# Patient Record
Sex: Male | Born: 1965 | Race: Black or African American | Hispanic: No | Marital: Single | Smoking: Never smoker
Health system: Southern US, Community
[De-identification: ages and names within clinical notes are randomized; demographics above are authoritative.]

## PROBLEM LIST (undated history)

## (undated) DIAGNOSIS — M1990 Unspecified osteoarthritis, unspecified site: Secondary | ICD-10-CM

## (undated) DIAGNOSIS — I10 Essential (primary) hypertension: Secondary | ICD-10-CM

## (undated) DIAGNOSIS — I499 Cardiac arrhythmia, unspecified: Secondary | ICD-10-CM

## (undated) DIAGNOSIS — E119 Type 2 diabetes mellitus without complications: Secondary | ICD-10-CM

## (undated) HISTORY — DX: Type 2 diabetes mellitus without complications: E11.9

## (undated) HISTORY — PX: HAND NERVE REPAIR: SHX1728

---

## 1999-09-03 ENCOUNTER — Emergency Department (HOSPITAL_COMMUNITY): Admission: EM | Admit: 1999-09-03 | Discharge: 1999-09-03 | Payer: Self-pay | Admitting: Unknown Physician Specialty

## 1999-09-03 ENCOUNTER — Encounter: Payer: Self-pay | Admitting: Emergency Medicine

## 2009-04-24 DIAGNOSIS — I499 Cardiac arrhythmia, unspecified: Secondary | ICD-10-CM

## 2009-04-24 HISTORY — DX: Cardiac arrhythmia, unspecified: I49.9

## 2010-08-15 ENCOUNTER — Other Ambulatory Visit: Payer: Self-pay | Admitting: Family Medicine

## 2010-08-15 DIAGNOSIS — M545 Low back pain: Secondary | ICD-10-CM

## 2010-08-18 ENCOUNTER — Ambulatory Visit
Admission: RE | Admit: 2010-08-18 | Discharge: 2010-08-18 | Disposition: A | Discharge status: Home / Self Care | Payer: 59 | Source: Ambulatory Visit | Attending: Family Medicine | Admitting: Family Medicine

## 2010-08-18 DIAGNOSIS — M545 Low back pain: Secondary | ICD-10-CM

## 2010-08-18 MED ORDER — GADOBENATE DIMEGLUMINE 529 MG/ML IV SOLN
18.0000 mL | Freq: Once | INTRAVENOUS | Status: AC | PRN
  Administered 2010-08-18: 18 mL via INTRAVENOUS

## 2011-05-29 ENCOUNTER — Encounter (HOSPITAL_COMMUNITY): Payer: Self-pay | Admitting: Respiratory Therapy

## 2011-05-29 ENCOUNTER — Other Ambulatory Visit: Payer: Self-pay | Admitting: Orthopedic Surgery

## 2011-05-31 ENCOUNTER — Other Ambulatory Visit: Payer: Self-pay | Admitting: Orthopedic Surgery

## 2011-06-02 ENCOUNTER — Encounter (HOSPITAL_COMMUNITY)
Admission: RE | Admit: 2011-06-02 | Discharge: 2011-06-02 | Disposition: A | Discharge status: Home / Self Care | Payer: Worker's Compensation | Source: Ambulatory Visit | Attending: Orthopedic Surgery | Admitting: Orthopedic Surgery

## 2011-06-02 ENCOUNTER — Encounter (HOSPITAL_COMMUNITY): Payer: Self-pay

## 2011-06-02 ENCOUNTER — Other Ambulatory Visit: Payer: Self-pay

## 2011-06-02 HISTORY — DX: Unspecified osteoarthritis, unspecified site: M19.90

## 2011-06-02 HISTORY — DX: Essential (primary) hypertension: I10

## 2011-06-02 HISTORY — DX: Cardiac arrhythmia, unspecified: I49.9

## 2011-06-02 LAB — COMPREHENSIVE METABOLIC PANEL
Alkaline Phosphatase: 68 U/L (ref 39–117)
BUN: 18 mg/dL (ref 6–23)
Creatinine, Ser: 0.94 mg/dL (ref 0.50–1.35)
Glucose, Bld: 112 mg/dL — ABNORMAL HIGH (ref 70–99)
Potassium: 4.2 mEq/L (ref 3.5–5.1)
Total Bilirubin: 0.6 mg/dL (ref 0.3–1.2)
Total Protein: 7.5 g/dL (ref 6.0–8.3)

## 2011-06-02 LAB — CBC
HCT: 43.1 % (ref 39.0–52.0)
Hemoglobin: 15.1 g/dL (ref 13.0–17.0)
MCHC: 35 g/dL (ref 30.0–36.0)
MCV: 89.2 fL (ref 78.0–100.0)

## 2011-06-02 LAB — SURGICAL PCR SCREEN
MRSA, PCR: NEGATIVE
Staphylococcus aureus: NEGATIVE

## 2011-06-02 LAB — DIFFERENTIAL
Basophils Absolute: 0 10*3/uL (ref 0.0–0.1)
Eosinophils Relative: 3 % (ref 0–5)
Lymphocytes Relative: 43 % (ref 12–46)
Monocytes Absolute: 0.5 10*3/uL (ref 0.1–1.0)
Monocytes Relative: 8 % (ref 3–12)

## 2011-06-02 LAB — APTT: aPTT: 30 seconds (ref 24–37)

## 2011-06-02 LAB — URINALYSIS, ROUTINE W REFLEX MICROSCOPIC
Leukocytes, UA: NEGATIVE
Nitrite: NEGATIVE
Protein, ur: NEGATIVE mg/dL
Specific Gravity, Urine: 1.025 (ref 1.005–1.030)
Urobilinogen, UA: 1 mg/dL (ref 0.0–1.0)

## 2011-06-02 LAB — TYPE AND SCREEN
ABO/RH(D): A POS
Antibody Screen: NEGATIVE

## 2011-06-02 NOTE — Pre-Procedure Instructions (Addendum)
19 Gary Luna  06/02/2011   Your procedure is scheduled on 2.14.13  Report to [facility] at 920 AM.  Call this number if you have problems the morning of surgery: [phone]   Remember:   Do not eat food:After Midnight.  May have clear liquids: up to 4 Hours before arrival.  Clear liquids include soda, tea, black coffee, apple or grape juice, broth.  Take these medicines the morning of surgery with A SIP OF WATER:STOP meloxicam, omega 3 multi vit  today   Do not wear jewelry, make-up or nail polish.  Do not wear lotions, powders, or perfumes. You may wear deodorant.  Do not shave 48 hours prior to surgery.  Do not bring valuables to the hospital.  Contacts, dentures or bridgework may not be worn into surgery.  Leave suitcase in the car. After surgery it may be brought to your room.  For patients admitted to the hospital, checkout time is 11:00 AM the day of discharge.   Patients discharged the day of surgery will not be allowed to drive home.  Name and phone number of your driver: [name] [phone]  Special Instructions: Incentive Spirometry - Practice and bring it with you on the day of surgery. and CHG Shower Use Special Wash: 1/2 bottle night before surgery and 1/2 bottle morning of surgery.   Please read over the following fact sheets that you were given: Pain Booklet, Coughing and Deep Breathing, Blood Transfusion Information, MRSA Information and Surgical Site Infection Prevention

## 2011-06-07 MED ORDER — CEFAZOLIN SODIUM-DEXTROSE 2-3 GM-% IV SOLR
2.0000 g | INTRAVENOUS | Status: AC
  Administered 2011-06-08: 2 g via INTRAVENOUS
  Filled 2011-06-07: qty 50

## 2011-06-08 ENCOUNTER — Encounter (HOSPITAL_COMMUNITY)
Admission: RE | Disposition: A | Discharge status: Home / Self Care | Payer: Self-pay | Source: Ambulatory Visit | Attending: Orthopedic Surgery

## 2011-06-08 ENCOUNTER — Inpatient Hospital Stay (HOSPITAL_COMMUNITY)
Admission: RE | Admit: 2011-06-08 | Discharge: 2011-06-09 | DRG: 491 | Disposition: A | Discharge status: Home / Self Care | Payer: Worker's Compensation | Source: Ambulatory Visit | Attending: Orthopedic Surgery | Admitting: Orthopedic Surgery

## 2011-06-08 ENCOUNTER — Encounter (HOSPITAL_COMMUNITY): Payer: Self-pay | Admitting: Certified Registered"

## 2011-06-08 ENCOUNTER — Ambulatory Visit (HOSPITAL_COMMUNITY): Payer: Worker's Compensation

## 2011-06-08 ENCOUNTER — Encounter (HOSPITAL_COMMUNITY): Payer: Self-pay | Admitting: *Deleted

## 2011-06-08 ENCOUNTER — Ambulatory Visit (HOSPITAL_COMMUNITY): Payer: Worker's Compensation | Admitting: Certified Registered"

## 2011-06-08 DIAGNOSIS — M129 Arthropathy, unspecified: Secondary | ICD-10-CM | POA: Diagnosis present

## 2011-06-08 DIAGNOSIS — M4800 Spinal stenosis, site unspecified: Secondary | ICD-10-CM

## 2011-06-08 DIAGNOSIS — Z01818 Encounter for other preprocedural examination: Secondary | ICD-10-CM

## 2011-06-08 DIAGNOSIS — M48061 Spinal stenosis, lumbar region without neurogenic claudication: Principal | ICD-10-CM | POA: Diagnosis present

## 2011-06-08 DIAGNOSIS — Z79899 Other long term (current) drug therapy: Secondary | ICD-10-CM

## 2011-06-08 DIAGNOSIS — I10 Essential (primary) hypertension: Secondary | ICD-10-CM | POA: Diagnosis present

## 2011-06-08 DIAGNOSIS — Z01812 Encounter for preprocedural laboratory examination: Secondary | ICD-10-CM

## 2011-06-08 DIAGNOSIS — Z01810 Encounter for preprocedural cardiovascular examination: Secondary | ICD-10-CM

## 2011-06-08 HISTORY — PX: LUMBAR LAMINECTOMY/DECOMPRESSION MICRODISCECTOMY: SHX5026

## 2011-06-08 SURGERY — LUMBAR LAMINECTOMY/DECOMPRESSION MICRODISCECTOMY
Anesthesia: General | Site: Back | Laterality: Bilateral | Wound class: Clean

## 2011-06-08 MED ORDER — ROCURONIUM BROMIDE 100 MG/10ML IV SOLN
INTRAVENOUS | Status: DC | PRN
  Administered 2011-06-08: 50 mg via INTRAVENOUS

## 2011-06-08 MED ORDER — PHENYLEPHRINE HCL 10 MG/ML IJ SOLN
INTRAMUSCULAR | Status: DC | PRN
  Administered 2011-06-08 (×2): 40 ug via INTRAVENOUS
  Administered 2011-06-08: 80 ug via INTRAVENOUS

## 2011-06-08 MED ORDER — ACETAMINOPHEN 325 MG PO TABS: 650.0000 mg | ORAL_TABLET | ORAL | Status: DC | PRN

## 2011-06-08 MED ORDER — HYDROMORPHONE HCL PF 1 MG/ML IJ SOLN
0.2500 mg | INTRAMUSCULAR | Status: DC | PRN
  Administered 2011-06-08 (×2): 0.5 mg via INTRAVENOUS

## 2011-06-08 MED ORDER — ADULT MULTIVITAMIN W/MINERALS CH
1.0000 | ORAL_TABLET | Freq: Every day | ORAL | Status: DC
  Administered 2011-06-09: 1 via ORAL
  Filled 2011-06-08: qty 1

## 2011-06-08 MED ORDER — BUPIVACAINE-EPINEPHRINE 0.25% -1:200000 IJ SOLN
INTRAMUSCULAR | Status: DC | PRN
  Administered 2011-06-08: 6 mL

## 2011-06-08 MED ORDER — METHYLPREDNISOLONE ACETATE 40 MG/ML IJ SUSP
INTRAMUSCULAR | Status: DC | PRN
  Administered 2011-06-08: 80 mg

## 2011-06-08 MED ORDER — MENTHOL 3 MG MT LOZG
1.0000 | LOZENGE | OROMUCOSAL | Status: DC | PRN
  Administered 2011-06-09: 3 mg via ORAL
  Filled 2011-06-08: qty 9

## 2011-06-08 MED ORDER — PROPOFOL 10 MG/ML IV EMUL
INTRAVENOUS | Status: DC | PRN
  Administered 2011-06-08: 200 mg via INTRAVENOUS

## 2011-06-08 MED ORDER — VECURONIUM BROMIDE 10 MG IV SOLR
INTRAVENOUS | Status: DC | PRN
  Administered 2011-06-08: 1 mg via INTRAVENOUS
  Administered 2011-06-08: 2 mg via INTRAVENOUS

## 2011-06-08 MED ORDER — HEMOSTATIC AGENTS (NO CHARGE) OPTIME
TOPICAL | Status: DC | PRN
  Administered 2011-06-08: 1 via TOPICAL

## 2011-06-08 MED ORDER — ONDANSETRON HCL 4 MG/2ML IJ SOLN: 4.0000 mg | Freq: Four times a day (QID) | INTRAMUSCULAR | Status: DC | PRN

## 2011-06-08 MED ORDER — POTASSIUM CHLORIDE IN NACL 20-0.9 MEQ/L-% IV SOLN
INTRAVENOUS | Status: DC
  Filled 2011-06-08 (×4): qty 1000

## 2011-06-08 MED ORDER — MIDAZOLAM HCL 5 MG/5ML IJ SOLN
INTRAMUSCULAR | Status: DC | PRN
  Administered 2011-06-08: 2 mg via INTRAVENOUS

## 2011-06-08 MED ORDER — PHENOL 1.4 % MT LIQD: 1.0000 | OROMUCOSAL | Status: DC | PRN

## 2011-06-08 MED ORDER — SODIUM CHLORIDE 0.9 % IJ SOLN: 3.0000 mL | Freq: Two times a day (BID) | INTRAMUSCULAR | Status: DC

## 2011-06-08 MED ORDER — DIAZEPAM 5 MG PO TABS
5.0000 mg | ORAL_TABLET | Freq: Four times a day (QID) | ORAL | Status: DC | PRN
  Administered 2011-06-08 – 2011-06-09 (×3): 5 mg via ORAL
  Filled 2011-06-08 (×3): qty 1

## 2011-06-08 MED ORDER — HYDROCHLOROTHIAZIDE 25 MG PO TABS
25.0000 mg | ORAL_TABLET | Freq: Every day | ORAL | Status: DC
  Administered 2011-06-08 – 2011-06-09 (×2): 25 mg via ORAL
  Filled 2011-06-08 (×2): qty 1

## 2011-06-08 MED ORDER — DEXAMETHASONE SODIUM PHOSPHATE 4 MG/ML IJ SOLN
INTRAMUSCULAR | Status: DC | PRN
  Administered 2011-06-08 (×2): 4 mg via INTRAVENOUS

## 2011-06-08 MED ORDER — 0.9 % SODIUM CHLORIDE (POUR BTL) OPTIME
TOPICAL | Status: DC | PRN
  Administered 2011-06-08: 1000 mL

## 2011-06-08 MED ORDER — NEOSTIGMINE METHYLSULFATE 1 MG/ML IJ SOLN
INTRAMUSCULAR | Status: DC | PRN
  Administered 2011-06-08: 3 mg via INTRAVENOUS

## 2011-06-08 MED ORDER — FENTANYL CITRATE 0.05 MG/ML IJ SOLN
INTRAMUSCULAR | Status: DC | PRN
  Administered 2011-06-08: 50 ug via INTRAVENOUS
  Administered 2011-06-08: 150 ug via INTRAVENOUS
  Administered 2011-06-08: 50 ug via INTRAVENOUS
  Administered 2011-06-08 (×3): 100 ug via INTRAVENOUS

## 2011-06-08 MED ORDER — GLYCOPYRROLATE 0.2 MG/ML IJ SOLN
INTRAMUSCULAR | Status: DC | PRN
  Administered 2011-06-08: .4 mg via INTRAVENOUS

## 2011-06-08 MED ORDER — THROMBIN 20000 UNITS EX KIT
PACK | CUTANEOUS | Status: DC | PRN
  Administered 2011-06-08: 13:00:00 via TOPICAL

## 2011-06-08 MED ORDER — DEXTROSE 5 % IV SOLN
INTRAVENOUS | Status: DC | PRN
  Administered 2011-06-08: 12:00:00 via INTRAVENOUS

## 2011-06-08 MED ORDER — BENAZEPRIL-HYDROCHLOROTHIAZIDE 20-25 MG PO TABS: 1.0000 | ORAL_TABLET | Freq: Every day | ORAL | Status: DC

## 2011-06-08 MED ORDER — ACETAMINOPHEN 650 MG RE SUPP: 650.0000 mg | RECTAL | Status: DC | PRN

## 2011-06-08 MED ORDER — OXYCODONE-ACETAMINOPHEN 5-325 MG PO TABS
1.0000 | ORAL_TABLET | ORAL | Status: DC | PRN
  Administered 2011-06-08 – 2011-06-09 (×3): 2 via ORAL
  Filled 2011-06-08 (×3): qty 2

## 2011-06-08 MED ORDER — BENAZEPRIL HCL 20 MG PO TABS
20.0000 mg | ORAL_TABLET | Freq: Every day | ORAL | Status: DC
  Administered 2011-06-08 – 2011-06-09 (×2): 20 mg via ORAL
  Filled 2011-06-08 (×2): qty 1

## 2011-06-08 MED ORDER — THERA M PLUS PO TABS: 1.0000 | ORAL_TABLET | Freq: Every day | ORAL | Status: DC

## 2011-06-08 MED ORDER — POVIDONE-IODINE 7.5 % EX SOLN
Freq: Once | CUTANEOUS | Status: DC
  Filled 2011-06-08: qty 118

## 2011-06-08 MED ORDER — MORPHINE SULFATE 4 MG/ML IJ SOLN
2.0000 mg | INTRAMUSCULAR | Status: DC | PRN
  Administered 2011-06-08: 2 mg via INTRAVENOUS
  Filled 2011-06-08: qty 1

## 2011-06-08 MED ORDER — CEFAZOLIN SODIUM 1-5 GM-% IV SOLN
1.0000 g | Freq: Three times a day (TID) | INTRAVENOUS | Status: AC
  Administered 2011-06-08 – 2011-06-09 (×2): 1 g via INTRAVENOUS
  Filled 2011-06-08 (×4): qty 50

## 2011-06-08 MED ORDER — SODIUM CHLORIDE 0.9 % IJ SOLN: 3.0000 mL | INTRAMUSCULAR | Status: DC | PRN

## 2011-06-08 MED ORDER — LACTATED RINGERS IV SOLN
INTRAVENOUS | Status: DC | PRN
  Administered 2011-06-08 (×3): via INTRAVENOUS

## 2011-06-08 MED ORDER — HYDROXYZINE HCL 25 MG PO TABS: 50.0000 mg | ORAL_TABLET | ORAL | Status: DC | PRN

## 2011-06-08 MED ORDER — ZOLPIDEM TARTRATE 5 MG PO TABS: 5.0000 mg | ORAL_TABLET | Freq: Every evening | ORAL | Status: DC | PRN

## 2011-06-08 MED ORDER — HYDROXYZINE HCL 50 MG/ML IM SOLN: 50.0000 mg | INTRAMUSCULAR | Status: DC | PRN

## 2011-06-08 MED ORDER — LACTATED RINGERS IV SOLN
INTRAVENOUS | Status: DC
  Administered 2011-06-08: 11:00:00 via INTRAVENOUS

## 2011-06-08 MED ORDER — ONDANSETRON HCL 4 MG/2ML IJ SOLN
INTRAMUSCULAR | Status: DC | PRN
  Administered 2011-06-08: 4 mg via INTRAVENOUS

## 2011-06-08 SURGICAL SUPPLY — 60 items
APL SKNCLS STERI-STRIP NONHPOA (GAUZE/BANDAGES/DRESSINGS)
BENZOIN TINCTURE PRP APPL 2/3 (GAUZE/BANDAGES/DRESSINGS) IMPLANT
BUR ROUND PRECISION 4.0 (BURR) IMPLANT
CANISTER SUCTION 2500CC (MISCELLANEOUS) ×2 IMPLANT
CLOTH BEACON ORANGE TIMEOUT ST (SAFETY) ×2 IMPLANT
CONT SPEC STER OR (MISCELLANEOUS) IMPLANT
CORDS BIPOLAR (ELECTRODE) ×2 IMPLANT
COVER SURGICAL LIGHT HANDLE (MISCELLANEOUS) ×2 IMPLANT
DRAIN CHANNEL 10F 3/8 F FF (DRAIN) IMPLANT
DRAIN WOUND SNY 15 RND (WOUND CARE) ×2 IMPLANT
DRAPE POUCH INSTRU U-SHP 10X18 (DRAPES) ×4 IMPLANT
DRAPE SURG 17X23 STRL (DRAPES) ×8 IMPLANT
DURAPREP 26ML APPLICATOR (WOUND CARE) ×2 IMPLANT
ELECT BLADE 4.0 EZ CLEAN MEGAD (MISCELLANEOUS)
ELECT BLADE 6.5 EXT (BLADE) IMPLANT
ELECT CAUTERY BLADE 6.4 (BLADE) ×2 IMPLANT
ELECT REM PT RETURN 9FT ADLT (ELECTROSURGICAL) ×2
ELECTRODE BLDE 4.0 EZ CLN MEGD (MISCELLANEOUS) IMPLANT
ELECTRODE REM PT RTRN 9FT ADLT (ELECTROSURGICAL) ×1 IMPLANT
EVACUATOR SILICONE 100CC (DRAIN) ×2 IMPLANT
FILTER STRAW FLUID ASPIR (MISCELLANEOUS) ×2 IMPLANT
GAUZE SPONGE 4X4 16PLY XRAY LF (GAUZE/BANDAGES/DRESSINGS) ×4 IMPLANT
GLOVE BIO SURGEON STRL SZ8 (GLOVE) ×2 IMPLANT
GLOVE BIOGEL PI IND STRL 8 (GLOVE) ×1 IMPLANT
GLOVE BIOGEL PI INDICATOR 8 (GLOVE) ×1
GOWN STRL NON-REIN LRG LVL3 (GOWN DISPOSABLE) ×4 IMPLANT
IV CATH 14GX2 1/4 (CATHETERS) ×2 IMPLANT
KIT BASIN OR (CUSTOM PROCEDURE TRAY) ×2 IMPLANT
KIT ROOM TURNOVER OR (KITS) ×2 IMPLANT
NEEDLE 18GX1X1/2 (RX/OR ONLY) (NEEDLE) ×2 IMPLANT
NEEDLE HYPO 25GX1X1/2 BEV (NEEDLE) ×2 IMPLANT
NEEDLE SPNL 18GX3.5 QUINCKE PK (NEEDLE) ×4 IMPLANT
NEEDLE SPNL 22GX3.5 QUINCKE BK (NEEDLE) IMPLANT
NS IRRIG 1000ML POUR BTL (IV SOLUTION) ×2 IMPLANT
PACK LAMINECTOMY ORTHO (CUSTOM PROCEDURE TRAY) ×2 IMPLANT
PACK UNIVERSAL I (CUSTOM PROCEDURE TRAY) ×2 IMPLANT
PAD ARMBOARD 7.5X6 YLW CONV (MISCELLANEOUS) ×4 IMPLANT
PATTIES SURGICAL .5 X.5 (GAUZE/BANDAGES/DRESSINGS) IMPLANT
PATTIES SURGICAL .5 X1 (DISPOSABLE) ×2 IMPLANT
PATTIES SURGICAL 1X1 (DISPOSABLE) IMPLANT
SPONGE GAUZE 4X4 12PLY (GAUZE/BANDAGES/DRESSINGS) ×2 IMPLANT
STRIP CLOSURE SKIN 1/2X4 (GAUZE/BANDAGES/DRESSINGS) IMPLANT
SURGIFLO TRUKIT (HEMOSTASIS) ×2 IMPLANT
SUT ETHILON 3 0 FSL (SUTURE) IMPLANT
SUT MNCRL AB 3-0 PS2 27 (SUTURE) IMPLANT
SUT VIC AB 0 CT1 27 (SUTURE)
SUT VIC AB 0 CT1 27XBRD ANBCTR (SUTURE) IMPLANT
SUT VIC AB 0 CT2 27 (SUTURE) ×2 IMPLANT
SUT VIC AB 1 CT1 18XCR BRD 8 (SUTURE) ×1 IMPLANT
SUT VIC AB 1 CT1 8-18 (SUTURE) ×1
SUT VIC AB 2-0 CT2 18 VCP726D (SUTURE) ×2 IMPLANT
SYR 20CC LL (SYRINGE) IMPLANT
SYR BULB IRRIGATION 50ML (SYRINGE) ×2 IMPLANT
SYR CONTROL 10ML LL (SYRINGE) ×2 IMPLANT
SYR TB 1ML 26GX3/8 SAFETY (SYRINGE) IMPLANT
SYR TB 1ML LUER SLIP (SYRINGE) IMPLANT
TOWEL OR 17X24 6PK STRL BLUE (TOWEL DISPOSABLE) ×2 IMPLANT
TOWEL OR 17X26 10 PK STRL BLUE (TOWEL DISPOSABLE) ×2 IMPLANT
WATER STERILE IRR 1000ML POUR (IV SOLUTION) ×2 IMPLANT
YANKAUER SUCT BULB TIP NO VENT (SUCTIONS) ×2 IMPLANT

## 2011-06-08 NOTE — Transfer of Care (Signed)
Immediate Anesthesia Transfer of Care Note  Patient: Gary Luna  Procedure(s) Performed: Procedure(s) (LRB): LUMBAR LAMINECTOMY/DECOMPRESSION MICRODISCECTOMY (Bilateral)  Patient Location: PACU  Anesthesia Type: General  Level of Consciousness: awake, oriented, sedated, patient cooperative and responds to stimulation  Airway & Oxygen Therapy: Patient Spontanous Breathing and Patient connected to nasal cannula oxygen  Post-op Assessment: Report given to PACU RN, Post -op Vital signs reviewed and stable, Patient moving all extremities and Patient moving all extremities X 4  Post vital signs: Reviewed and stable  Complications: No apparent anesthesia complications

## 2011-06-08 NOTE — Preoperative (Signed)
Beta Blockers   Reason not to administer Beta Blockers:Not Applicable 

## 2011-06-08 NOTE — Anesthesia Procedure Notes (Signed)
Procedure Name: Intubation Date/Time: 06/08/2011 11:59 AM Performed by: Delbert Harness Pre-anesthesia Checklist: Patient identified, Timeout performed, Emergency Drugs available, Suction available and Patient being monitored Patient Re-evaluated:Patient Re-evaluated prior to inductionOxygen Delivery Method: Circle System Utilized Preoxygenation: Pre-oxygenation with 100% oxygen Intubation Type: IV induction Ventilation: Mask ventilation without difficulty and Oral airway inserted - appropriate to patient size Laryngoscope Size: Mac and 3 Grade View: Grade II Tube type: Oral Tube size: 8.0 mm Number of attempts: 1 Airway Equipment and Method: stylet Placement Confirmation: ETT inserted through vocal cords under direct vision,  positive ETCO2 and breath sounds checked- equal and bilateral Secured at: 21 cm Tube secured with: Tape Dental Injury: Teeth and Oropharynx as per pre-operative assessment

## 2011-06-08 NOTE — Anesthesia Preprocedure Evaluation (Signed)
Anesthesia Evaluation  Patient identified by MRN, date of birth, ID band Patient awake    Reviewed: Allergy & Precautions, H&P , NPO status , Patient's Chart, lab work & pertinent test results  Airway Mallampati: II  Neck ROM: full    Dental   Pulmonary          Cardiovascular hypertension,     Neuro/Psych    GI/Hepatic   Endo/Other    Renal/GU      Musculoskeletal  (+) Arthritis -,   Abdominal   Peds  Hematology   Anesthesia Other Findings   Reproductive/Obstetrics                           Anesthesia Physical Anesthesia Plan  ASA: II  Anesthesia Plan: General   Post-op Pain Management:    Induction: Intravenous  Airway Management Planned: Oral ETT  Additional Equipment:   Intra-op Plan:   Post-operative Plan: Extubation in OR  Informed Consent: I have reviewed the patients History and Physical, chart, labs and discussed the procedure including the risks, benefits and alternatives for the proposed anesthesia with the patient or authorized representative who has indicated his/her understanding and acceptance.     Plan Discussed with: CRNA and Surgeon  Anesthesia Plan Comments:         Anesthesia Quick Evaluation  
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
## 2011-06-08 NOTE — OR Nursing (Signed)
Trial Power equipment used : Midas Rex with 4.mm bur set at 130 psi.

## 2011-06-08 NOTE — Anesthesia Postprocedure Evaluation (Signed)
Anesthesia Post Note  Patient: Gary Luna  Procedure(s) Performed: Procedure(s) (LRB): LUMBAR LAMINECTOMY/DECOMPRESSION MICRODISCECTOMY (Bilateral)  Anesthesia type: General  Patient location: PACU  Post pain: Pain level controlled and Adequate analgesia  Post assessment: Post-op Vital signs reviewed, Patient's Cardiovascular Status Stable, Respiratory Function Stable, Patent Airway and Pain level controlled  Last Vitals:  Filed Vitals:   06/08/11 1042  BP: 137/97  Pulse:   Temp:   Resp:     Post vital signs: Reviewed and stable  Level of consciousness: awake, alert  and oriented  Complications: No apparent anesthesia complications

## 2011-06-09 NOTE — Op Note (Signed)
NAMEARIAS, Gary Luna NO.:  [id number]  MEDICAL RECORD NO.:  [id number]  LOCATION:  3536                         FACILITY:  [facility]  PHYSICIAN:  Estill Bamberg, MD      DATE OF BIRTH:  [birth year]  DATE OF PROCEDURE:  06/08/2011 DATE OF DISCHARGE:                              OPERATIVE REPORT   PREOPERATIVE DIAGNOSIS:  L3-4, L5-S1 spinal stenosis.  POSTOP DIAGNOSIS:  L3-4, L5-S1 spinal stenosis.  PROCEDURE:  L3-4, L5-S1 laminectomy, bilateral partial facetectomy, and bilateral foraminotomy.  SURGEON:  Estill Bamberg, MD  ASSISTANT:  Janace Litten, OPA  ANESTHESIA:  General endotracheal anesthesia.  COMPLICATIONS:  None.  DISPOSITION:  Stable.  ESTIMATED BLOOD LOSS:  Minimal.  INDICATIONS FOR PROCEDURE:  Briefly, Gary Luna is a very pleasant 46- year-old male, who initially presented to me with severe debilitating pain in both his right and his left leg.  The patient did have epidural injections by Dr. Romero Belling.  He did clearly report temporary improvement with his injection.  Upon his evaluation with me, he clearly had severe and debilitating pain in both the right and the left leg.  I did review an MRI which was notable for varying degrees of lateral recess stenosis and moderate-to-severe neural foraminal stenosis involving the L3-4 and the L5-S1 regions.  The L4-5 region was not involved.  Given the patient's failure of conservative care, I did have a discussion with the patient regarding going forward with a two-level decompressive procedure as noted above.  The patient fully understood the risks and limitations of the procedure as outlined in my preoperative note.  OPERATIVE DETAILS:  On June 08, 2011, the patient was brought to surgery and general endotracheal anesthesia was administered.  The patient was placed prone on a well-padded Jackson flat bed with a Wilson frame.  All bony prominences were meticulously padded.   Time-out procedure was performed and antibiotics were given.  I then obtained a lateral intraoperative radiograph with 2 18-gauge spinal needles to optimize the location of the incision.  I then made an incision over the midline from approximately spinous process of L2 to approximately spinous process of L3, to the approximately spinous process of S1.  I then subperiosteally exposed the lamina of L3, L4, L5, and S1.  I did obtain a lateral intraoperative radiograph with a Kocher placed at the level of the L4 pedicle to confirm the appropriate levels of decompression.  Of note, I did take a 2nd intraoperative radiograph to confirm to get confirm appropriate operative levels with a nerve hook out the L3-4 neuroforamen.  I started the decompression at the L5-S1 level.  The spinous process of L5 and the superior aspect of S1 were removed.  A central decompression was performed removing the central lamina of L5 in addition to the facet joints on both the right and the left sides.  I did meticulously go forward with a thorough neural foraminal decompression on both the right and the left sides.  Of note, there was noted to be rather severe neural foraminal compression bilaterally.  It was initially very difficult to pass a [facility] out the neural foramen on both the right and the left  sides, however, I did meticulously remove the superior articular process of S1 in order to confirm a thorough and adequate decompression of the exiting L5 nerves. There is also a bone and facet hypertrophy and ligamentum flavum hypertrophy bilaterally.  I then turned my attention towards the L3-4 interspace.  The spinous process of L4 and the inferior aspect of L3 was also removed.  I again went forward with a decompression as previously described.  Again, a neural foramina were noted to be skin was severely stenotic, has reflected on the patient's preoperative MRI.  I did therefore meticulously go forward  with the decompression to address all of the stenosis involving both the lateral recesses and the neural foramen on the right and left sides.  I was very happy with the decompression.  I then controlled all epidural bleeding using a Surgiflo and bipolar electrocautery.  I then infiltrated 40 mg of Depo-Medrol at the L3-4 and the L5-S1 interspaces.  I then placed a #15 Blake drain over the posterior elements that were previously decompressed.  Of note, I did copiously irrigate the wound with a total of 2 L of normal saline throughout the case.  I then closed the fascia using #1 Vicryl.  The subcutaneous layer was then closed using 2-0 Vicryl and the skin was closed using 3-0 Monocryl.  Benzoin and a sterile dressing was applied. Of note, Janace Litten was my assistant throughout the procedure and aided in essential retraction and suctioning required throughout the surgery.  Also of note, all instrument counts were correct at the termination of the procedure.     Estill Bamberg, MD     MD/MEDQ  D:  06/08/2011  T:  06/09/2011  Job:  [id number]  cc:   Kandyce Rud, MD Romero Belling

## 2011-06-09 NOTE — Progress Notes (Signed)
Pt feels well, minimal discomfort.  NVI Dressing intact drain output 70cc/10 hours  S/p lumbar decompression  - d/c drain - reinforce dressing - d/c home today - f/u 2 weeks

## 2011-06-09 NOTE — Discharge Summary (Signed)
[name], [name]:  [id number]  MEDICAL RECORD NO.:  [id number]  LOCATION:  3536                         FACILITY:  [facility]  PHYSICIAN:  Estill Bamberg, MD      DATE OF BIRTH:  November 16, 1965  DATE OF ADMISSION:  06/08/2011 DATE OF DISCHARGE:  06/09/2011                              DISCHARGE SUMMARY   ADMISSION DIAGNOSIS:  L3-4, L5-S1 spinal stenosis.  DISCHARGE DIAGNOSIS:  L3-4, L5-S1 spinal stenosis.  ADMITTING PHYSICIAN:  Estill Bamberg, MD.  ADMISSION HISTORY:  Briefly, Mr. Gary Luna is a very pleasant 46 year old male who presented to me with severe debilitating pain in both his right and his left leg.  The patient did have conservative care at the hands of Dr. Romero Belling.  The patient did get temporary improvement with epidural injections.  They reviewed an MRI which was notable for spinal stenosis at the L3-4 and L5-S1 levels.  Given the patient's failure of conservative care, he was brought to Surgery on June 08, 2011, for a decompressive procedure involving the L3-4 and L5-S1 levels.  HOSPITAL COURSE:  On June 08, 2011, the patient underwent the procedure noted above.  The patient tolerated the procedure well and was transferred to recovery in stable condition.  Patient was evaluated by me on the morning of postop day #1.  The patient's bilateral buttock pain from spinal stenosis was entirely alleviated.  He did have minimal discomfort in his back, which is easily controlled with his oral pain medications and Valium.  The patient was uneventfully discharged home in the morning of postop day #1.  DISCHARGE INSTRUCTIONS:  The patient will keep the wound dry for a total of 5 days.  He will avoid any twisting, bending, or lifting.  He will follow up in my office in approximately 2 weeks after his procedure.  He was discharged home on Norco for pain and Valium for spasms.     Estill Bamberg, MD     MD/MEDQ  D:  06/09/2011  T:   06/09/2011  Job:  [id number]  cc:   Romero Belling, Dr.

## 2011-06-12 ENCOUNTER — Encounter (HOSPITAL_COMMUNITY): Payer: Self-pay | Admitting: Orthopedic Surgery

## 2012-12-25 IMAGING — CR DG LUMBAR SPINE 2-3V
1 series · 1 of 1 positions shown · non-contrast
Comparison: Preoperative study 06/02/2011.  Lumbar MRI 08/18/2010.

CLINICAL DATA: 45-year-old male undergoing lumbar surgery.

LUMBAR SPINE - 2-3 VIEW

[view not recorded]
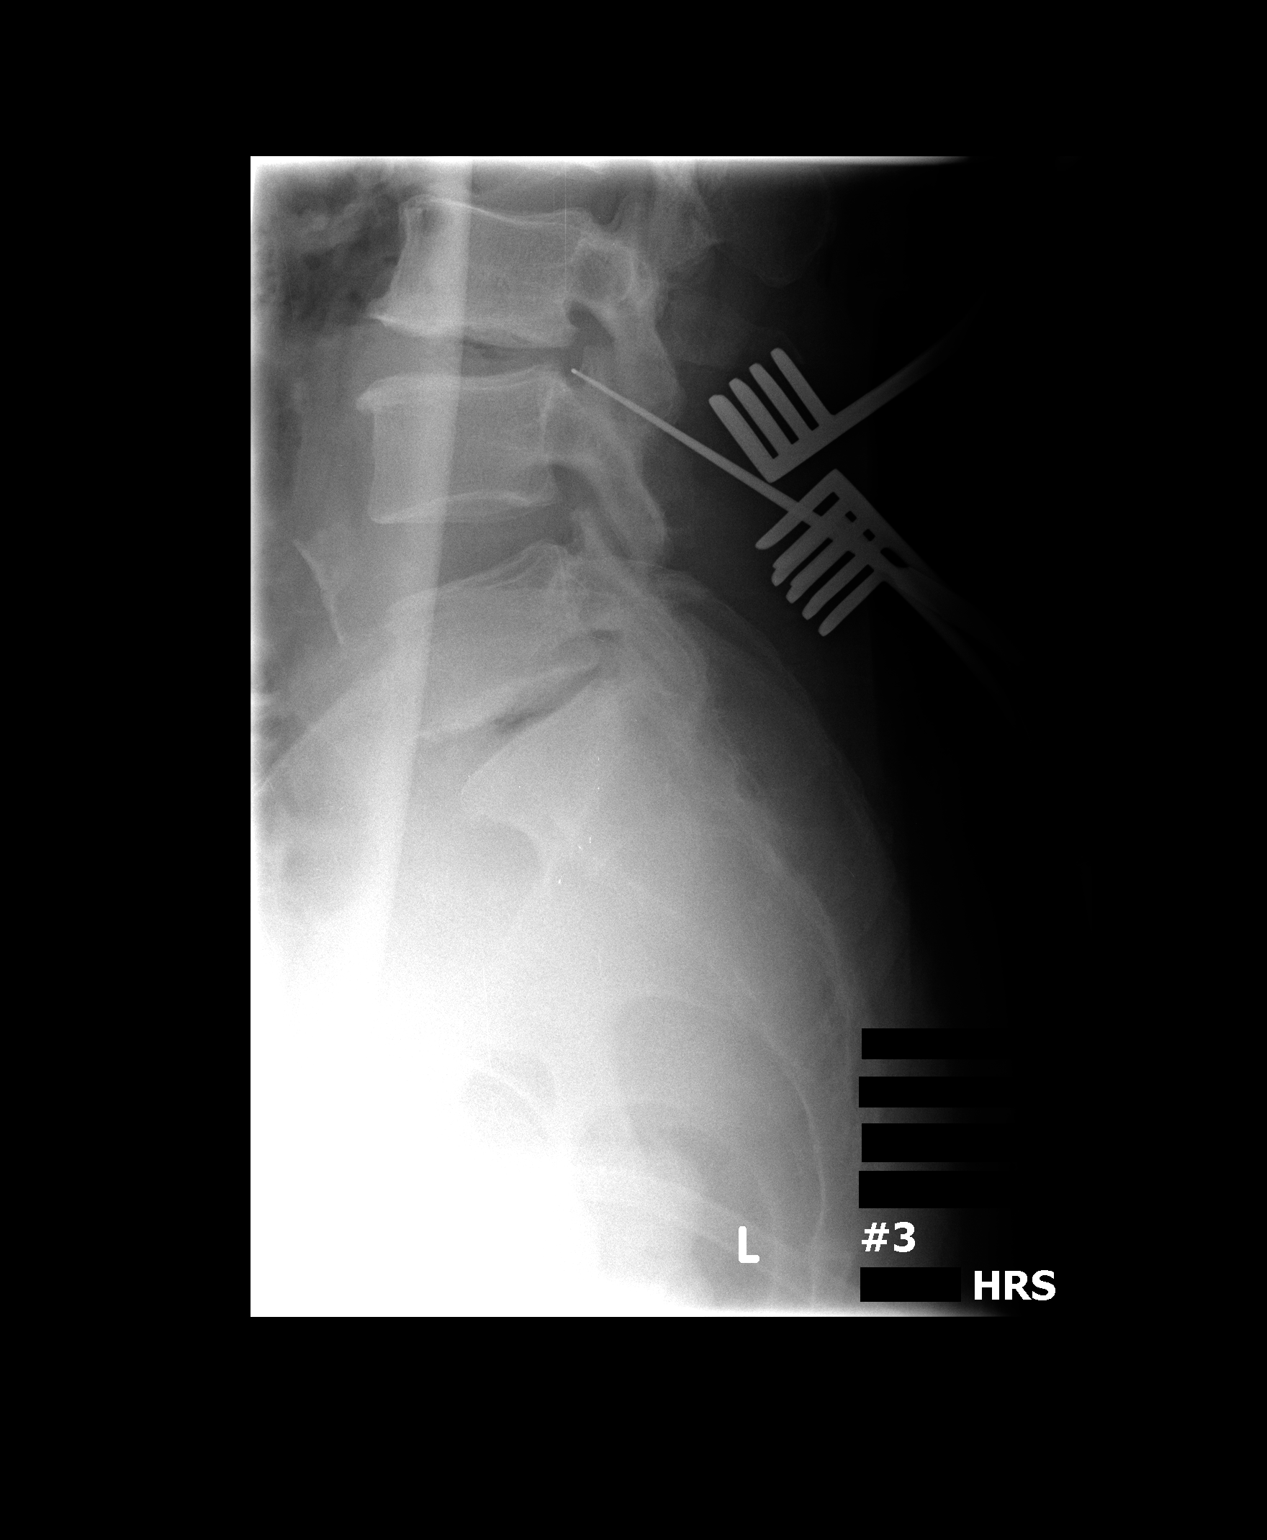

[1 of 1 positions shown; findings below may reference images not displayed]

FINDINGS: Three intraoperative portable cross-table lateral view
lumbar spine.

Film #1 2126 hours.  Cephalad needle directed at the L4-L5 disc
space level.  Caudal needle directed at the S1 level.

Film #2 at 6027 hours.  Surgical clamp projects over the L4 spinous
process.

Film #3 at 0685 hours.  Surgical probe directed at the L3-L4 disc
space.
IMPRESSION: Intraoperative localization as above.

## 2013-12-09 ENCOUNTER — Encounter: Payer: Managed Care, Other (non HMO) | Attending: Family Medicine

## 2013-12-09 VITALS — Ht 70.0 in | Wt 191.3 lb

## 2013-12-09 DIAGNOSIS — Z713 Dietary counseling and surveillance: Secondary | ICD-10-CM | POA: Insufficient documentation

## 2013-12-09 DIAGNOSIS — E119 Type 2 diabetes mellitus without complications: Secondary | ICD-10-CM

## 2013-12-09 NOTE — Progress Notes (Signed)
Patient was seen on 12/10/13 for the first of a series of three diabetes self-management courses at [facility].  Patient Education Plan per assessed needs and concerns is to attend four course education program for Diabetes Self Management Education.  Current HbA1c: 6.5%  The following learning objectives were met by the patient during this class:  Describe diabetes  State some common risk factors for diabetes  Defines the role of glucose and insulin  Identifies type of diabetes and pathophysiology  Describe the relationship between diabetes and cardiovascular risk  State the members of the Healthcare Team  States the rationale for glucose monitoring  State when to test glucose  State their individual Target Range  State the importance of logging glucose readings  Describe how to interpret glucose readings  Identifies A1C target  Explain the correlation between A1c and eAG values  State symptoms and treatment of high blood glucose  State symptoms and treatment of low blood glucose  Explain proper technique for glucose testing  Identifies proper sharps disposal  Handouts given during class include:  Living Well with Diabetes book  Carb Counting and Meal Planning book  Meal Plan Card  Carbohydrate guide  Meal planning worksheet  Low Sodium Flavoring Tips  The diabetes portion plate  Q6P to eAG Conversion Chart  Diabetes Medications  Diabetes Recommended Care Schedule  Support Group  Diabetes Success Plan  Core Class Satisfaction Survey  Follow-Up Plan:  Attend core 2

## 2013-12-16 DIAGNOSIS — E119 Type 2 diabetes mellitus without complications: Secondary | ICD-10-CM | POA: Diagnosis not present

## 2013-12-16 NOTE — Progress Notes (Signed)
Patient was seen on 12-16-13 for the second of a series of three diabetes self-management courses at the [facility]. The following learning objectives were met by the patient during this class:   Describe the role of different macronutrients on glucose  Explain how carbohydrates affect blood glucose  State what foods contain the most carbohydrates  Demonstrate carbohydrate counting  Demonstrate how to read Nutrition Facts food label  Describe effects of various fats on heart health  Describe the importance of good nutrition for health and healthy eating strategies  Describe techniques for managing your shopping, cooking and meal planning  List strategies to follow meal plan when dining out  Describe the effects of alcohol on glucose and how to use it safely  Goals:  Follow Diabetes Meal Plan as instructed  Eat 3 meals and 2 snacks, every 3-5 hrs  Limit carbohydrate intake to 45 grams carbohydrate/meal Limit carbohydrate intake to 15 grams carbohydrate/snack Add lean protein foods to meals/snacks  Monitor glucose levels as instructed by your doctor   Follow-Up Plan:  Attend Core 3  Work towards following your personal food plan.    

## 2013-12-23 ENCOUNTER — Encounter: Payer: Managed Care, Other (non HMO) | Attending: Family Medicine

## 2013-12-23 DIAGNOSIS — Z713 Dietary counseling and surveillance: Secondary | ICD-10-CM | POA: Insufficient documentation

## 2013-12-23 DIAGNOSIS — E119 Type 2 diabetes mellitus without complications: Secondary | ICD-10-CM | POA: Diagnosis not present

## 2013-12-24 NOTE — Progress Notes (Signed)
Patient was seen on 12-23-13 for the third of a series of three diabetes self-management courses at the [facility]. The following learning objectives were met by the patient during this class:    State the amount of activity recommended for healthy living   Describe activities suitable for individual needs   Identify ways to regularly incorporate activity into daily life   Identify barriers to activity and ways to over come these barriers  Identify diabetes medications being personally used and their primary action for lowering glucose and possible side effects   Describe role of stress on blood glucose and develop strategies to address psychosocial issues   Identify diabetes complications and ways to prevent them  Explain how to manage diabetes during illness   Evaluate success in meeting personal goal   Establish 2-3 goals that they will plan to diligently work on until they return for the  24-monthfollow-up visit  Goals:  Follow Diabetes Meal Plan as instructed  Aim for 15-30 mins of physical activity daily as tolerated  Bring food record and glucose log to your follow up visit  Your patient has established the following 4 month goals in their individualized success plan: I will count my carb choices at most meals and snacks   Your patient has identified these potential barriers to change:  None stated   Your patient has identified their diabetes self-care support plan as  None stated  Plan:  Attend Core 4 in 4 months

## 2014-04-27 ENCOUNTER — Ambulatory Visit: Payer: Self-pay

## 2017-04-23 ENCOUNTER — Encounter (HOSPITAL_COMMUNITY): Payer: Self-pay | Admitting: Family Medicine

## 2017-04-23 ENCOUNTER — Ambulatory Visit (HOSPITAL_COMMUNITY)
Admission: EM | Admit: 2017-04-23 | Discharge: 2017-04-23 | Disposition: A | Discharge status: Home / Self Care | Payer: 59 | Attending: Family Medicine | Admitting: Family Medicine

## 2017-04-23 DIAGNOSIS — M5440 Lumbago with sciatica, unspecified side: Secondary | ICD-10-CM

## 2017-04-23 MED ORDER — PREDNISONE 20 MG PO TABS: 40.0000 mg | ORAL_TABLET | Freq: Every day | ORAL | 0 refills | Status: AC

## 2017-04-23 MED ORDER — TRAMADOL HCL 50 MG PO TABS: 50.0000 mg | ORAL_TABLET | Freq: Four times a day (QID) | ORAL | 0 refills | Status: DC | PRN

## 2017-04-23 MED ORDER — CYCLOBENZAPRINE HCL 10 MG PO TABS: 10.0000 mg | ORAL_TABLET | Freq: Two times a day (BID) | ORAL | 0 refills | Status: AC | PRN

## 2017-04-23 NOTE — Discharge Instructions (Signed)
For more long term control of back pain follow up with Ortho/Pain doctor you are seeing for injections.  In the meantime I have given you a 5 day course of steroids. I have also prescribed Ultram for severe pain, do not drive or operate machinery while using this.   Please return if you loose ability to control bowels/bladder, develop increased numbness or tingling in lower legs, increased pain.

## 2017-04-23 NOTE — ED Triage Notes (Signed)
Pt here for lower back pain with radiation down left leg. Reports when the pain hits its drops him to the floor. sts that he has been taking methocarbamol and meloxicam without relief. Has been seeing and orthopedic and was scheduled for MRI but went out of town.
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
## 2017-10-16 DIAGNOSIS — M48061 Spinal stenosis, lumbar region without neurogenic claudication: Secondary | ICD-10-CM | POA: Insufficient documentation

## 2018-01-29 DIAGNOSIS — M48061 Spinal stenosis, lumbar region without neurogenic claudication: Secondary | ICD-10-CM | POA: Insufficient documentation

## 2018-02-01 ENCOUNTER — Other Ambulatory Visit: Payer: Self-pay | Admitting: Neurosurgery

## 2018-02-08 ENCOUNTER — Other Ambulatory Visit: Payer: Self-pay | Admitting: Neurosurgery

## 2018-02-11 ENCOUNTER — Inpatient Hospital Stay (HOSPITAL_COMMUNITY): Admission: RE | Admit: 2018-02-11 | Payer: Self-pay | Source: Ambulatory Visit

## 2018-02-20 ENCOUNTER — Inpatient Hospital Stay (HOSPITAL_COMMUNITY): Admission: RE | Admit: 2018-02-20 | Payer: 59 | Source: Ambulatory Visit | Admitting: Neurosurgery

## 2018-02-20 ENCOUNTER — Encounter (HOSPITAL_COMMUNITY): Admission: RE | Payer: Self-pay | Source: Ambulatory Visit

## 2018-02-20 SURGERY — POSTERIOR LUMBAR FUSION 3 LEVEL
Anesthesia: General

## 2018-03-06 ENCOUNTER — Other Ambulatory Visit: Payer: Self-pay | Admitting: Neurosurgery

## 2018-03-07 ENCOUNTER — Other Ambulatory Visit: Payer: Self-pay | Admitting: Neurosurgery

## 2018-03-28 ENCOUNTER — Encounter (HOSPITAL_COMMUNITY)
Admission: RE | Admit: 2018-03-28 | Discharge: 2018-03-28 | Disposition: A | Discharge status: Home / Self Care | Payer: 59 | Source: Ambulatory Visit | Attending: Neurosurgery | Admitting: Neurosurgery

## 2018-03-28 ENCOUNTER — Other Ambulatory Visit: Payer: Self-pay

## 2018-03-28 ENCOUNTER — Encounter (HOSPITAL_COMMUNITY): Payer: Self-pay

## 2018-03-28 DIAGNOSIS — Z01818 Encounter for other preprocedural examination: Secondary | ICD-10-CM | POA: Insufficient documentation

## 2018-03-28 LAB — CBC
HCT: 44.5 % (ref 39.0–52.0)
Hemoglobin: 14.3 g/dL (ref 13.0–17.0)
MCH: 28.7 pg (ref 26.0–34.0)
MCHC: 32.1 g/dL (ref 30.0–36.0)
MCV: 89.2 fL (ref 80.0–100.0)
PLATELETS: 289 10*3/uL (ref 150–400)
RBC: 4.99 MIL/uL (ref 4.22–5.81)
RDW: 13.1 % (ref 11.5–15.5)
WBC: 6.5 10*3/uL (ref 4.0–10.5)
nRBC: 0 % (ref 0.0–0.2)

## 2018-03-28 LAB — TYPE AND SCREEN
ABO/RH(D): A POS
Antibody Screen: NEGATIVE

## 2018-03-28 LAB — BASIC METABOLIC PANEL
Anion gap: 8 (ref 5–15)
BUN: 15 mg/dL (ref 6–20)
CHLORIDE: 102 mmol/L (ref 98–111)
CO2: 27 mmol/L (ref 22–32)
CREATININE: 1.11 mg/dL (ref 0.61–1.24)
Calcium: 9.4 mg/dL (ref 8.9–10.3)
GFR calc Af Amer: >60 mL/min (ref >60)
GLUCOSE: 121 mg/dL — AB (ref 70–99)
POTASSIUM: 4 mmol/L (ref 3.5–5.1)
Sodium: 137 mmol/L (ref 135–145)

## 2018-03-28 LAB — SURGICAL PCR SCREEN
MRSA, PCR: NEGATIVE
Staphylococcus aureus: NEGATIVE

## 2018-03-28 NOTE — Pre-Procedure Instructions (Signed)
Sharlotte Alamoerence Matton  03/28/2018      [id number] - [place], [place] - [address] [place] [id number] Phone: [phone] Fax: [phone]    Your procedure is scheduled on Thursday December 12.  [facility] at [facility]:15 A.M.  Call this number if you have problems the morning of surgery:  [phone]   Remember:  Do not eat or drink after midnight.    Take these medicines the morning of surgery with A SIP OF WATER: NONE  7 days prior to surgery STOP taking any Aspirin(unless otherwise instructed by your surgeon), Aleve, Naproxen, Ibuprofen, Motrin, Advil, Goody's, BC's, all herbal medications, fish oil, and all vitamins     Do not wear jewelry  Do not wear lotions, powders, or colognes, or deodorant.  Do not shave 48 hours prior to surgery.  Men may shave face and neck.  Do not bring valuables to the hospital.  [facility] is not responsible for any belongings or valuables.  Contacts, dentures or bridgework may not be worn into surgery.  Leave your suitcase in the car.  After surgery it may be brought to your room.  For patients admitted to the hospital, discharge time will be determined by your treatment team.  Patients discharged the day of surgery will not be allowed to drive home.   Special instructions:    - Preparing For Surgery  Before surgery, you can play an important role. Because skin is not sterile, your skin needs to be as free of germs as possible. You can reduce the number of germs on your skin by washing with CHG (chlorahexidine gluconate) Soap before surgery.  CHG is an antiseptic cleaner which kills germs and bonds with the skin to continue killing germs even after washing.    Oral Hygiene is also important to reduce your risk of infection.  Remember - BRUSH YOUR TEETH THE MORNING OF SURGERY WITH YOUR REGULAR TOOTHPASTE  Please do not use if you  have an allergy to CHG or antibacterial soaps. If your skin becomes reddened/irritated stop using the CHG.  Do not shave (including legs and underarms) for at least 48 hours prior to first CHG shower. It is OK to shave your face.  Please follow these instructions carefully.   1. Shower the NIGHT BEFORE SURGERY and the MORNING OF SURGERY with CHG.   2. If you chose to wash your hair, wash your hair first as usual with your normal shampoo.  3. After you shampoo, rinse your hair and body thoroughly to remove the shampoo.  4. Use CHG as you would any other liquid soap. You can apply CHG directly to the skin and wash gently with a scrungie or a clean washcloth.   5. Apply the CHG Soap to your body ONLY FROM THE NECK DOWN.  Do not use on open wounds or open sores. Avoid contact with your eyes, ears, mouth and genitals (private parts). Wash Face and genitals (private parts)  with your normal soap.  6. Wash thoroughly, paying special attention to the area where your surgery will be performed.  7. Thoroughly rinse your body with warm water from the neck down.  8. DO NOT shower/wash with your normal soap after using and rinsing off the CHG Soap.  9. Pat yourself dry with a CLEAN TOWEL.  10. Wear CLEAN PAJAMAS to bed the night before surgery, wear comfortable clothes the morning of surgery  11. Place CLEAN SHEETS on your bed the night of your first shower and DO NOT SLEEP WITH PETS.    Day of Surgery:  Do not apply any deodorants/lotions.  Please wear clean clothes to the hospital/surgery center.   Remember to brush your teeth WITH YOUR REGULAR TOOTHPASTE.    Please read over the following fact sheets that you were given. Coughing and Deep Breathing, MRSA Information and Surgical Site Infection Prevention

## 2018-03-28 NOTE — Progress Notes (Signed)
   03/28/18 1539  OBSTRUCTIVE SLEEP APNEA  Have you ever been diagnosed with sleep apnea through a sleep study? No  Do you snore loudly (loud enough to be heard through closed doors)?  0  Do you often feel tired, fatigued, or sleepy during the daytime (such as falling asleep during driving or talking to someone)? 0  Has anyone observed you stop breathing during your sleep? 0  Do you have, or are you being treated for high blood pressure? 1  BMI more than 35 kg/m2? 0  Age > 50 (1-yes) 1  Neck circumference greater than:Male 16 inches or larger, Male 17inches or larger? 1  Male Gender (Yes=1) 1  Obstructive Sleep Apnea Score 4  Score 5 or greater   (Dr. Kirtland BouchardK little)

## 2018-03-28 NOTE — Progress Notes (Signed)
PCP is Dr. Wyline BeadyK Little.  Lov was 2018 (patient said he missed his last appt. Time) Patient denies any murmur, cp, sob. No cardiac testing done.   He does state, after reviewing his vital signs  bp 222/127, 209/116, he has stopped taking his BP med.  Hasn't taken for awhile. Spoke with Fayrene FearingJames, [place].  He came and spoke with patient. Am sending fax to Dr. Fredirick MaudlinLittle's office  [phone] for LOV and EKG.

## 2018-03-28 NOTE — Pre-Procedure Instructions (Signed)
Lavalle Skoda  03/28/2018      [id number] - [place], [place] - [address] OF [facility] [address] [place] [address] Phone: [phone] Fax: [phone]    Your procedure is scheduled on Thursday December 12th   Report to [facility] Admitting at 8:15 A.M.             (posted surgery time 10:15a - 4:38p)   Call this number if you have problems the morning of surgery:  [phone]   Remember:  Do not eat  Any foods or drink any liquids after midnight, Wednesday.    Take these medicines the morning of surgery with A SIP OF WATER: NONE  7 days prior to surgery STOP taking any Aspirin(unless otherwise instructed by your surgeon), Aleve, Naproxen, Ibuprofen, Motrin, Advil, Goody's, BC's, all herbal medications, fish oil, and all vitamins     Do not wear jewelry - NO RINGS  Do not wear lotions,  colognes, or deodorant.   Men may shave face and neck.  Do not bring valuables to the hospital.  [facility] is not responsible for any belongings or valuables.  Contacts, dentures or bridgework may not be worn into surgery.  Leave your suitcase in the car.  After surgery it may be brought to your room.  For patients admitted to the hospital, discharge time will be determined by your treatment team.      [facility]- Preparing For Surgery  Before surgery, you can play an important role. Because skin is not sterile, your skin needs to be as free of germs as possible. You can reduce the number of germs on your skin by washing with CHG (chlorahexidine gluconate) Soap before surgery.  CHG is an antiseptic cleaner which kills germs and bonds with the skin to continue killing germs even after washing.    Oral Hygiene is also important to reduce your risk of infection.    Remember - BRUSH YOUR TEETH THE MORNING OF SURGERY WITH YOUR REGULAR TOOTHPASTE  Please do not use if you have an allergy to CHG or antibacterial soaps.  If your skin becomes reddened/irritated stop using the CHG.  Do not shave (including legs and underarms) for at least 48 hours prior to first CHG shower. It is OK to shave your face.  Please follow these instructions carefully.   1. Shower the NIGHT BEFORE SURGERY and the MORNING OF SURGERY with CHG.   2. If you chose to wash your hair, wash your hair first as usual with your normal shampoo.  3. After you shampoo, rinse your hair and body thoroughly to remove the shampoo.  4. Use CHG as you would any other liquid soap. You can apply CHG directly to the skin and wash gently with a scrungie or a clean washcloth.   5. Apply the CHG Soap to your body ONLY FROM THE NECK DOWN.  Do not use on open wounds or open sores. Avoid contact with your eyes, ears, mouth and genitals (private parts). Wash Face and genitals (private parts)  with your normal soap.  6. Wash thoroughly, paying special attention to the area where your surgery will be performed.  7. Thoroughly rinse your body with warm water from the neck down.  8. DO NOT shower/wash with your normal soap after using and rinsing off the CHG Soap.  9. Pat yourself dry with a CLEAN TOWEL.  10. Wear CLEAN PAJAMAS to bed the night before surgery, wear  comfortable clothes the morning of surgery  11. Place CLEAN SHEETS on your bed the night of your first shower and DO NOT SLEEP WITH PETS.    Day of Surgery:  Do not apply any deodorants/lotions.   Please wear clean clothes to the hospital/surgery center.    Remember to brush your teeth WITH YOUR REGULAR TOOTHPASTE.    Please read over the following fact sheets that you were given.

## 2018-04-01 NOTE — Anesthesia Preprocedure Evaluation (Addendum)
Anesthesia Evaluation  Patient identified by MRN, date of birth, ID band Patient awake    Reviewed: Allergy & Precautions, NPO status , Patient's Chart, lab work & pertinent test results  Airway Mallampati: II  TM Distance: >3 FB     Dental   Pulmonary    breath sounds clear to auscultation       Cardiovascular hypertension, + dysrhythmias  Rhythm:Regular Rate:Normal     Neuro/Psych    GI/Hepatic negative GI ROS, Neg liver ROS,   Endo/Other  diabetes  Renal/GU      Musculoskeletal  (+) Arthritis ,   Abdominal   Peds  Hematology   Anesthesia Other Findings   Reproductive/Obstetrics                            Anesthesia Physical Anesthesia Plan  ASA: III  Anesthesia Plan: General   Post-op Pain Management:    Induction: Intravenous  PONV Risk Score and Plan: Treatment may vary due to age or medical condition, Ondansetron, Dexamethasone and Midazolam  Airway Management Planned: Oral ETT  Additional Equipment:   Intra-op Plan:   Post-operative Plan: Extubation in OR  Informed Consent: I have reviewed the patients History and Physical, chart, labs and discussed the procedure including the risks, benefits and alternatives for the proposed anesthesia with the patient or authorized representative who has indicated his/her understanding and acceptance.   Dental advisory given  Plan Discussed with: CRNA, Anesthesiologist and Surgeon  Anesthesia Plan Comments: (Pt with significantly elevated BP at PAT: 222/127, 209/116. Per PCP note 08/16/17 he is prescribed benazapril/hctz 20-25. Pt states he is not taking the medication, did not think he needed it. I spoke with him at length at PAT appt regarding this. He will start taking meds as prescribed. He will recheck BP. If continues to be high he will call PCP Dr. Clarene DukeLittle and Dr. Lovell SheehanJenkins. I notified Dr. Lovell SheehanJenkins office of pt's poor HTN control.)     Anesthesia Quick Evaluation

## 2018-04-04 ENCOUNTER — Encounter (HOSPITAL_COMMUNITY): Payer: Self-pay | Admitting: General Practice

## 2018-04-04 ENCOUNTER — Inpatient Hospital Stay (HOSPITAL_COMMUNITY): Payer: 59 | Admitting: Physician Assistant

## 2018-04-04 ENCOUNTER — Inpatient Hospital Stay (HOSPITAL_COMMUNITY): Payer: 59

## 2018-04-04 ENCOUNTER — Encounter (HOSPITAL_COMMUNITY)
Admission: RE | Disposition: A | Discharge status: Home / Self Care | Payer: Self-pay | Source: Home / Self Care | Attending: Neurosurgery

## 2018-04-04 ENCOUNTER — Inpatient Hospital Stay (HOSPITAL_COMMUNITY)
Admission: RE | Admit: 2018-04-04 | Discharge: 2018-04-09 | DRG: 454 | Disposition: A | Discharge status: Home / Self Care | Payer: 59 | Attending: Neurosurgery | Admitting: Neurosurgery

## 2018-04-04 ENCOUNTER — Other Ambulatory Visit: Payer: Self-pay

## 2018-04-04 DIAGNOSIS — L7634 Postprocedural seroma of skin and subcutaneous tissue following other procedure: Secondary | ICD-10-CM | POA: Diagnosis not present

## 2018-04-04 DIAGNOSIS — Y838 Other surgical procedures as the cause of abnormal reaction of the patient, or of later complication, without mention of misadventure at the time of the procedure: Secondary | ICD-10-CM | POA: Diagnosis not present

## 2018-04-04 DIAGNOSIS — M5116 Intervertebral disc disorders with radiculopathy, lumbar region: Secondary | ICD-10-CM | POA: Diagnosis present

## 2018-04-04 DIAGNOSIS — M4726 Other spondylosis with radiculopathy, lumbar region: Secondary | ICD-10-CM | POA: Diagnosis present

## 2018-04-04 DIAGNOSIS — M48062 Spinal stenosis, lumbar region with neurogenic claudication: Secondary | ICD-10-CM | POA: Diagnosis present

## 2018-04-04 DIAGNOSIS — M4807 Spinal stenosis, lumbosacral region: Secondary | ICD-10-CM | POA: Diagnosis present

## 2018-04-04 DIAGNOSIS — Z419 Encounter for procedure for purposes other than remedying health state, unspecified: Secondary | ICD-10-CM

## 2018-04-04 DIAGNOSIS — M4316 Spondylolisthesis, lumbar region: Secondary | ICD-10-CM | POA: Diagnosis present

## 2018-04-04 DIAGNOSIS — I10 Essential (primary) hypertension: Secondary | ICD-10-CM | POA: Diagnosis present

## 2018-04-04 DIAGNOSIS — M5117 Intervertebral disc disorders with radiculopathy, lumbosacral region: Secondary | ICD-10-CM | POA: Diagnosis present

## 2018-04-04 LAB — GLUCOSE, CAPILLARY
GLUCOSE-CAPILLARY: 166 mg/dL — AB (ref 70–99)
GLUCOSE-CAPILLARY: 174 mg/dL — AB (ref 70–99)
Glucose-Capillary: 138 mg/dL — ABNORMAL HIGH (ref 70–99)

## 2018-04-04 SURGERY — POSTERIOR LUMBAR FUSION 3 LEVEL
Anesthesia: General | Site: Spine Lumbar

## 2018-04-04 MED ORDER — ONDANSETRON HCL 4 MG/2ML IJ SOLN
INTRAMUSCULAR | Status: DC | PRN
  Administered 2018-04-04: 4 mg via INTRAVENOUS

## 2018-04-04 MED ORDER — BISACODYL 10 MG RE SUPP: 10.0000 mg | Freq: Every day | RECTAL | Status: DC | PRN

## 2018-04-04 MED ORDER — SODIUM CHLORIDE 0.9% FLUSH
3.0000 mL | Freq: Two times a day (BID) | INTRAVENOUS | Status: DC
  Administered 2018-04-05 – 2018-04-09 (×6): 3 mL via INTRAVENOUS

## 2018-04-04 MED ORDER — BACITRACIN ZINC 500 UNIT/GM EX OINT
TOPICAL_OINTMENT | CUTANEOUS | Status: AC
  Filled 2018-04-04: qty 28.35

## 2018-04-04 MED ORDER — SUFENTANIL CITRATE 50 MCG/ML IV SOLN
INTRAVENOUS | Status: AC
  Filled 2018-04-04: qty 1

## 2018-04-04 MED ORDER — ROCURONIUM BROMIDE 10 MG/ML (PF) SYRINGE
PREFILLED_SYRINGE | INTRAVENOUS | Status: DC | PRN
  Administered 2018-04-04: 50 mg via INTRAVENOUS

## 2018-04-04 MED ORDER — SODIUM CHLORIDE (PF) 0.9 % IJ SOLN
INTRAMUSCULAR | Status: AC
  Filled 2018-04-04: qty 10

## 2018-04-04 MED ORDER — CYCLOBENZAPRINE HCL 10 MG PO TABS
10.0000 mg | ORAL_TABLET | Freq: Three times a day (TID) | ORAL | Status: DC | PRN
  Administered 2018-04-04 – 2018-04-08 (×7): 10 mg via ORAL
  Filled 2018-04-04 (×8): qty 1

## 2018-04-04 MED ORDER — SUFENTANIL CITRATE 50 MCG/ML IV SOLN
INTRAVENOUS | Status: DC | PRN
  Administered 2018-04-04: 20 ug via INTRAVENOUS
  Administered 2018-04-04 (×9): 10 ug via INTRAVENOUS

## 2018-04-04 MED ORDER — PHENOL 1.4 % MT LIQD: 1.0000 | OROMUCOSAL | Status: DC | PRN

## 2018-04-04 MED ORDER — OXYCODONE HCL 5 MG PO TABS
5.0000 mg | ORAL_TABLET | ORAL | Status: DC | PRN
  Administered 2018-04-04 – 2018-04-08 (×3): 5 mg via ORAL
  Filled 2018-04-04: qty 1

## 2018-04-04 MED ORDER — ACETAMINOPHEN 325 MG PO TABS
650.0000 mg | ORAL_TABLET | ORAL | Status: DC | PRN
  Administered 2018-04-08 – 2018-04-09 (×2): 650 mg via ORAL
  Filled 2018-04-04 (×3): qty 2

## 2018-04-04 MED ORDER — BENAZEPRIL-HYDROCHLOROTHIAZIDE 20-25 MG PO TABS: 1.0000 | ORAL_TABLET | Freq: Every day | ORAL | Status: DC

## 2018-04-04 MED ORDER — BACITRACIN ZINC 500 UNIT/GM EX OINT
TOPICAL_OINTMENT | CUTANEOUS | Status: DC | PRN
  Administered 2018-04-04: 1 via TOPICAL

## 2018-04-04 MED ORDER — PROPOFOL 10 MG/ML IV BOLUS
INTRAVENOUS | Status: DC | PRN
  Administered 2018-04-04: 170 mg via INTRAVENOUS
  Administered 2018-04-04: 10 mg via INTRAVENOUS

## 2018-04-04 MED ORDER — EPHEDRINE 5 MG/ML INJ
INTRAVENOUS | Status: AC
  Filled 2018-04-04: qty 10

## 2018-04-04 MED ORDER — ADULT MULTIVITAMIN W/MINERALS CH
1.0000 | ORAL_TABLET | Freq: Every day | ORAL | Status: DC
  Administered 2018-04-05 – 2018-04-09 (×4): 1 via ORAL
  Filled 2018-04-04 (×5): qty 1

## 2018-04-04 MED ORDER — ACETAMINOPHEN 500 MG PO TABS
1000.0000 mg | ORAL_TABLET | Freq: Four times a day (QID) | ORAL | Status: AC
  Administered 2018-04-04 – 2018-04-05 (×4): 1000 mg via ORAL
  Filled 2018-04-04 (×4): qty 2

## 2018-04-04 MED ORDER — ONDANSETRON HCL 4 MG/2ML IJ SOLN
INTRAMUSCULAR | Status: AC
  Filled 2018-04-04: qty 2

## 2018-04-04 MED ORDER — MIDAZOLAM HCL 5 MG/5ML IJ SOLN
INTRAMUSCULAR | Status: DC | PRN
  Administered 2018-04-04: 2 mg via INTRAVENOUS

## 2018-04-04 MED ORDER — LIDOCAINE HCL 1 % IJ SOLN
INTRAMUSCULAR | Status: DC | PRN
  Administered 2018-04-04: 12:00:00

## 2018-04-04 MED ORDER — AMLODIPINE BESYLATE 5 MG PO TABS
5.0000 mg | ORAL_TABLET | Freq: Every day | ORAL | Status: DC
  Administered 2018-04-05 – 2018-04-09 (×5): 5 mg via ORAL
  Filled 2018-04-04 (×5): qty 1

## 2018-04-04 MED ORDER — MENTHOL 3 MG MT LOZG: 1.0000 | LOZENGE | OROMUCOSAL | Status: DC | PRN

## 2018-04-04 MED ORDER — LIDOCAINE 2% (20 MG/ML) 5 ML SYRINGE
INTRAMUSCULAR | Status: AC
  Filled 2018-04-04: qty 5

## 2018-04-04 MED ORDER — BENAZEPRIL HCL 5 MG PO TABS: 5.0000 mg | ORAL_TABLET | Freq: Every day | ORAL | Status: DC

## 2018-04-04 MED ORDER — MORPHINE SULFATE (PF) 4 MG/ML IV SOLN
4.0000 mg | INTRAVENOUS | Status: DC | PRN
  Administered 2018-04-06: 2 mg via INTRAVENOUS
  Filled 2018-04-04: qty 1

## 2018-04-04 MED ORDER — SODIUM CHLORIDE 0.9 % IV SOLN
250.0000 mL | INTRAVENOUS | Status: DC
  Administered 2018-04-04: 250 mL via INTRAVENOUS

## 2018-04-04 MED ORDER — VANCOMYCIN HCL 1 G IV SOLR
INTRAVENOUS | Status: DC | PRN
  Administered 2018-04-04: 1000 mg

## 2018-04-04 MED ORDER — SODIUM CHLORIDE 0.9% FLUSH: 3.0000 mL | INTRAVENOUS | Status: DC | PRN

## 2018-04-04 MED ORDER — CHLORHEXIDINE GLUCONATE CLOTH 2 % EX PADS: 6.0000 | MEDICATED_PAD | Freq: Once | CUTANEOUS | Status: DC

## 2018-04-04 MED ORDER — PHENYLEPHRINE HCL 10 MG/ML IJ SOLN
INTRAMUSCULAR | Status: AC
  Filled 2018-04-04: qty 1

## 2018-04-04 MED ORDER — ACETAMINOPHEN 650 MG RE SUPP: 650.0000 mg | RECTAL | Status: DC | PRN

## 2018-04-04 MED ORDER — BUPIVACAINE LIPOSOME 1.3 % IJ SUSP
20.0000 mL | INTRAMUSCULAR | Status: AC
  Administered 2018-04-04: 20 mL
  Filled 2018-04-04: qty 20

## 2018-04-04 MED ORDER — VANCOMYCIN HCL 1000 MG IV SOLR
INTRAVENOUS | Status: AC
  Filled 2018-04-04: qty 1000

## 2018-04-04 MED ORDER — ROCURONIUM BROMIDE 50 MG/5ML IV SOSY
PREFILLED_SYRINGE | INTRAVENOUS | Status: AC
  Filled 2018-04-04: qty 5

## 2018-04-04 MED ORDER — CEFAZOLIN SODIUM-DEXTROSE 2-4 GM/100ML-% IV SOLN
2.0000 g | Freq: Three times a day (TID) | INTRAVENOUS | Status: AC
  Administered 2018-04-04 – 2018-04-05 (×2): 2 g via INTRAVENOUS
  Filled 2018-04-04 (×2): qty 100

## 2018-04-04 MED ORDER — PROPOFOL 10 MG/ML IV BOLUS
INTRAVENOUS | Status: AC
  Filled 2018-04-04: qty 20

## 2018-04-04 MED ORDER — PHENYLEPHRINE 40 MCG/ML (10ML) SYRINGE FOR IV PUSH (FOR BLOOD PRESSURE SUPPORT)
PREFILLED_SYRINGE | INTRAVENOUS | Status: DC | PRN
  Administered 2018-04-04 (×2): 80 ug via INTRAVENOUS

## 2018-04-04 MED ORDER — SODIUM CHLORIDE 0.9 % IV SOLN
INTRAVENOUS | Status: DC | PRN
  Administered 2018-04-04: 50 ug/min via INTRAVENOUS

## 2018-04-04 MED ORDER — HYDROCHLOROTHIAZIDE 25 MG PO TABS
25.0000 mg | ORAL_TABLET | Freq: Every day | ORAL | Status: DC
  Administered 2018-04-05 – 2018-04-09 (×5): 25 mg via ORAL
  Filled 2018-04-04 (×5): qty 1

## 2018-04-04 MED ORDER — THROMBIN 20000 UNITS EX SOLR
CUTANEOUS | Status: DC | PRN
  Administered 2018-04-04: 12:00:00

## 2018-04-04 MED ORDER — OXYCODONE HCL 5 MG PO TABS
ORAL_TABLET | ORAL | Status: AC
  Filled 2018-04-04: qty 1

## 2018-04-04 MED ORDER — THROMBIN 5000 UNITS EX SOLR
OROMUCOSAL | Status: DC | PRN
  Administered 2018-04-04 (×2)

## 2018-04-04 MED ORDER — CEFAZOLIN SODIUM-DEXTROSE 2-4 GM/100ML-% IV SOLN
INTRAVENOUS | Status: AC
  Filled 2018-04-04: qty 100

## 2018-04-04 MED ORDER — LIDOCAINE 2% (20 MG/ML) 5 ML SYRINGE
INTRAMUSCULAR | Status: DC | PRN
  Administered 2018-04-04: 80 mg via INTRAVENOUS

## 2018-04-04 MED ORDER — ONDANSETRON HCL 4 MG/2ML IJ SOLN: 4.0000 mg | Freq: Four times a day (QID) | INTRAMUSCULAR | Status: DC | PRN

## 2018-04-04 MED ORDER — ONDANSETRON HCL 4 MG PO TABS
4.0000 mg | ORAL_TABLET | Freq: Four times a day (QID) | ORAL | Status: DC | PRN
  Administered 2018-04-05: 4 mg via ORAL
  Filled 2018-04-04 (×2): qty 1

## 2018-04-04 MED ORDER — DEXAMETHASONE SODIUM PHOSPHATE 10 MG/ML IJ SOLN
INTRAMUSCULAR | Status: AC
  Filled 2018-04-04: qty 1

## 2018-04-04 MED ORDER — DOCUSATE SODIUM 100 MG PO CAPS
100.0000 mg | ORAL_CAPSULE | Freq: Two times a day (BID) | ORAL | Status: DC
  Administered 2018-04-04 – 2018-04-09 (×9): 100 mg via ORAL
  Filled 2018-04-04 (×9): qty 1

## 2018-04-04 MED ORDER — OXYCODONE HCL 5 MG PO TABS
10.0000 mg | ORAL_TABLET | ORAL | Status: DC | PRN
  Administered 2018-04-04 – 2018-04-09 (×18): 10 mg via ORAL
  Filled 2018-04-04 (×21): qty 2

## 2018-04-04 MED ORDER — MIDAZOLAM HCL 2 MG/2ML IJ SOLN
INTRAMUSCULAR | Status: AC
  Filled 2018-04-04: qty 2

## 2018-04-04 MED ORDER — CEFAZOLIN SODIUM-DEXTROSE 2-4 GM/100ML-% IV SOLN
2.0000 g | INTRAVENOUS | Status: AC
  Administered 2018-04-04 (×2): 2 g via INTRAVENOUS

## 2018-04-04 MED ORDER — LACTATED RINGERS IV SOLN
INTRAVENOUS | Status: DC
  Administered 2018-04-04 – 2018-04-06 (×4): via INTRAVENOUS

## 2018-04-04 MED ORDER — FENTANYL CITRATE (PF) 100 MCG/2ML IJ SOLN: 25.0000 ug | INTRAMUSCULAR | Status: DC | PRN

## 2018-04-04 MED ORDER — SODIUM CHLORIDE 0.9 % IV SOLN
INTRAVENOUS | Status: DC | PRN
  Administered 2018-04-04: 11:00:00

## 2018-04-04 MED ORDER — BUPIVACAINE-EPINEPHRINE (PF) 0.25% -1:200000 IJ SOLN
INTRAMUSCULAR | Status: AC
  Filled 2018-04-04: qty 30

## 2018-04-04 MED ORDER — THROMBIN 5000 UNITS EX SOLR
CUTANEOUS | Status: AC
  Filled 2018-04-04: qty 5000

## 2018-04-04 MED ORDER — VITAMIN B-12 1000 MCG PO TABS
1000.0000 ug | ORAL_TABLET | Freq: Every day | ORAL | Status: DC
  Administered 2018-04-05 – 2018-04-09 (×4): 1000 ug via ORAL
  Filled 2018-04-04 (×4): qty 1

## 2018-04-04 MED ORDER — BENAZEPRIL HCL 20 MG PO TABS
20.0000 mg | ORAL_TABLET | Freq: Every day | ORAL | Status: DC
  Administered 2018-04-05 – 2018-04-09 (×5): 20 mg via ORAL
  Filled 2018-04-04 (×5): qty 1

## 2018-04-04 MED ORDER — ALBUMIN HUMAN 5 % IV SOLN
INTRAVENOUS | Status: DC | PRN
  Administered 2018-04-04: 13:00:00 via INTRAVENOUS

## 2018-04-04 MED ORDER — 0.9 % SODIUM CHLORIDE (POUR BTL) OPTIME
TOPICAL | Status: DC | PRN
  Administered 2018-04-04: 1000 mL

## 2018-04-04 MED ORDER — PHENYLEPHRINE 40 MCG/ML (10ML) SYRINGE FOR IV PUSH (FOR BLOOD PRESSURE SUPPORT)
PREFILLED_SYRINGE | INTRAVENOUS | Status: AC
  Filled 2018-04-04: qty 10

## 2018-04-04 MED ORDER — DEXAMETHASONE SODIUM PHOSPHATE 10 MG/ML IJ SOLN
INTRAMUSCULAR | Status: DC | PRN
  Administered 2018-04-04: 10 mg via INTRAVENOUS

## 2018-04-04 MED ORDER — CYCLOBENZAPRINE HCL 10 MG PO TABS
ORAL_TABLET | ORAL | Status: AC
  Filled 2018-04-04: qty 1

## 2018-04-04 MED ORDER — CEFAZOLIN SODIUM 1 G IJ SOLR
INTRAMUSCULAR | Status: AC
  Filled 2018-04-04: qty 20

## 2018-04-04 MED ORDER — EPHEDRINE SULFATE-NACL 50-0.9 MG/10ML-% IV SOSY
PREFILLED_SYRINGE | INTRAVENOUS | Status: DC | PRN
  Administered 2018-04-04: 10 mg via INTRAVENOUS
  Administered 2018-04-04: 5 mg via INTRAVENOUS

## 2018-04-04 MED ORDER — THROMBIN 20000 UNITS EX SOLR
CUTANEOUS | Status: AC
  Filled 2018-04-04: qty 20000

## 2018-04-04 SURGICAL SUPPLY — 81 items
BAG DECANTER FOR FLEXI CONT (MISCELLANEOUS) ×2 IMPLANT
BASKET BONE COLLECTION (BASKET) ×2 IMPLANT
BENZOIN TINCTURE PRP APPL 2/3 (GAUZE/BANDAGES/DRESSINGS) ×2 IMPLANT
BLADE CLIPPER SURG (BLADE) IMPLANT
BUR MATCHSTICK NEURO 3.0 LAGG (BURR) ×2 IMPLANT
BUR PRECISION FLUTE 6.0 (BURR) ×2 IMPLANT
CAGE ALTERA 10X31X9-13 15D (Cage) ×4 IMPLANT
CANISTER SUCT 3000ML PPV (MISCELLANEOUS) ×2 IMPLANT
CAP REVERE LOCKING (Cap) ×16 IMPLANT
CARTRIDGE OIL MAESTRO DRILL (MISCELLANEOUS) ×1 IMPLANT
CONN CROSSLINK REV 6.35 48-60 (Connector) ×2 IMPLANT
CONNECTOR CRSLNK REV6.35 48-60 (Connector) ×1 IMPLANT
CONT SPEC 4OZ CLIKSEAL STRL BL (MISCELLANEOUS) ×2 IMPLANT
COVER BACK TABLE 60X90IN (DRAPES) ×2 IMPLANT
COVER WAND RF STERILE (DRAPES) ×2 IMPLANT
DECANTER SPIKE VIAL GLASS SM (MISCELLANEOUS) IMPLANT
DERMABOND ADVANCED (GAUZE/BANDAGES/DRESSINGS) ×1
DERMABOND ADVANCED .7 DNX12 (GAUZE/BANDAGES/DRESSINGS) ×1 IMPLANT
DIFFUSER DRILL AIR PNEUMATIC (MISCELLANEOUS) ×2 IMPLANT
DRAPE C-ARM 42X72 X-RAY (DRAPES) ×4 IMPLANT
DRAPE HALF SHEET 40X57 (DRAPES) ×2 IMPLANT
DRAPE LAPAROTOMY 100X72X124 (DRAPES) ×2 IMPLANT
DRAPE SURG 17X23 STRL (DRAPES) ×8 IMPLANT
DRSG OPSITE POSTOP 4X8 (GAUZE/BANDAGES/DRESSINGS) ×2 IMPLANT
ELECT BLADE 4.0 EZ CLEAN MEGAD (MISCELLANEOUS) ×2
ELECT REM PT RETURN 9FT ADLT (ELECTROSURGICAL) ×2
ELECTRODE BLDE 4.0 EZ CLN MEGD (MISCELLANEOUS) ×1 IMPLANT
ELECTRODE REM PT RTRN 9FT ADLT (ELECTROSURGICAL) ×1 IMPLANT
EVACUATOR 1/8 PVC DRAIN (DRAIN) ×2 IMPLANT
GAUZE 4X4 16PLY RFD (DISPOSABLE) ×2 IMPLANT
GAUZE SPONGE 4X4 12PLY STRL (GAUZE/BANDAGES/DRESSINGS) ×2 IMPLANT
GLOVE BIO SURGEON STRL SZ8 (GLOVE) ×4 IMPLANT
GLOVE BIO SURGEON STRL SZ8.5 (GLOVE) ×4 IMPLANT
GLOVE BIOGEL M 6.5 STRL (GLOVE) ×6 IMPLANT
GLOVE BIOGEL PI IND STRL 6.5 (GLOVE) ×1 IMPLANT
GLOVE BIOGEL PI IND STRL 7.5 (GLOVE) ×2 IMPLANT
GLOVE BIOGEL PI IND STRL 8 (GLOVE) ×1 IMPLANT
GLOVE BIOGEL PI INDICATOR 6.5 (GLOVE) ×1
GLOVE BIOGEL PI INDICATOR 7.5 (GLOVE) ×2
GLOVE BIOGEL PI INDICATOR 8 (GLOVE) ×1
GLOVE EXAM NITRILE XL STR (GLOVE) IMPLANT
GLOVE SURG SS PI 7.0 STRL IVOR (GLOVE) ×6 IMPLANT
GLOVE SURG SS PI 7.5 STRL IVOR (GLOVE) ×6 IMPLANT
GOWN STRL REUS W/ TWL LRG LVL3 (GOWN DISPOSABLE) ×3 IMPLANT
GOWN STRL REUS W/ TWL XL LVL3 (GOWN DISPOSABLE) ×2 IMPLANT
GOWN STRL REUS W/TWL 2XL LVL3 (GOWN DISPOSABLE) IMPLANT
GOWN STRL REUS W/TWL LRG LVL3 (GOWN DISPOSABLE) ×3
GOWN STRL REUS W/TWL XL LVL3 (GOWN DISPOSABLE) ×2
HEMOSTAT POWDER KIT SURGIFOAM (HEMOSTASIS) ×4 IMPLANT
HEMOSTAT POWDER SURGIFOAM 1G (HEMOSTASIS) ×4 IMPLANT
IMPL SPINE RISE 8-15-10-26M (Neuro Prosthesis/Implant) ×2 IMPLANT
IMPLANT SPINE RISE 8-15-10-26M (Neuro Prosthesis/Implant) ×4 IMPLANT
KIT BASIN OR (CUSTOM PROCEDURE TRAY) ×2 IMPLANT
KIT INFUSE SMALL (Orthopedic Implant) ×2 IMPLANT
KIT TURNOVER KIT B (KITS) ×2 IMPLANT
MILL MEDIUM DISP (BLADE) ×2 IMPLANT
NEEDLE HYPO 21X1.5 SAFETY (NEEDLE) ×2 IMPLANT
NEEDLE HYPO 22GX1.5 SAFETY (NEEDLE) ×2 IMPLANT
NS IRRIG 1000ML POUR BTL (IV SOLUTION) ×2 IMPLANT
OIL CARTRIDGE MAESTRO DRILL (MISCELLANEOUS) ×2
PACK LAMINECTOMY NEURO (CUSTOM PROCEDURE TRAY) ×2 IMPLANT
PAD ARMBOARD 7.5X6 YLW CONV (MISCELLANEOUS) ×6 IMPLANT
PATTIES SURGICAL .5 X1 (DISPOSABLE) ×2 IMPLANT
PATTIES SURGICAL 1X1 (DISPOSABLE) ×2 IMPLANT
PUTTY DBM 10CC CALC GRAN (Putty) ×2 IMPLANT
ROD CURVED 100MM (Rod) ×4 IMPLANT
SCREW 7.5X50MM (Screw) ×4 IMPLANT
SCREW REVERE 6.35 75X55MM (Screw) ×12 IMPLANT
SPONGE LAP 4X18 RFD (DISPOSABLE) IMPLANT
SPONGE NEURO XRAY DETECT 1X3 (DISPOSABLE) ×2 IMPLANT
SPONGE SURGIFOAM ABS GEL 100 (HEMOSTASIS) ×2 IMPLANT
STRIP BIOACTIVE 20CC 25X100X8 (Miscellaneous) ×2 IMPLANT
STRIP CLOSURE SKIN 1/2X4 (GAUZE/BANDAGES/DRESSINGS) ×2 IMPLANT
SUT VIC AB 1 CT1 18XBRD ANBCTR (SUTURE) ×2 IMPLANT
SUT VIC AB 1 CT1 8-18 (SUTURE) ×2
SUT VIC AB 2-0 CP2 18 (SUTURE) ×6 IMPLANT
SYR 20CC LL (SYRINGE) ×2 IMPLANT
TOWEL GREEN STERILE (TOWEL DISPOSABLE) ×2 IMPLANT
TOWEL GREEN STERILE FF (TOWEL DISPOSABLE) ×2 IMPLANT
TRAY FOLEY MTR SLVR 16FR STAT (SET/KITS/TRAYS/PACK) ×2 IMPLANT
WATER STERILE IRR 1000ML POUR (IV SOLUTION) ×2 IMPLANT

## 2018-04-04 NOTE — Anesthesia Postprocedure Evaluation (Signed)
Anesthesia Post Note  Patient: Gary Luna  Procedure(s) Performed: POSTERIOR LUMBAR INTERBODY FUSION LUMBAR THREE-LUMBAR FOUR, LUMBAR FOUR- LUMBAR FIVE,LUMBAR FIVE-SACRAL ONE, INTERBODY PROSTHESIS, POSTERIOR INSTRUMENTATION (N/A Spine Lumbar)     Patient location during evaluation: PACU Anesthesia Type: General Level of consciousness: awake and alert Pain management: pain level controlled Vital Signs Assessment: post-procedure vital signs reviewed and stable Respiratory status: spontaneous breathing, nonlabored ventilation, respiratory function stable and patient connected to nasal cannula oxygen Cardiovascular status: blood pressure returned to baseline and stable Postop Assessment: no apparent nausea or vomiting Anesthetic complications: no    Last Vitals:  Vitals:   04/04/18 1856 04/04/18 1912  BP: (!) 145/99 (!) 152/101  Pulse: 100 100  Resp: 16 18  Temp:    SpO2: 97% 99%    Last Pain:  Vitals:   04/04/18 1853  TempSrc:   PainSc: 4                  [name]
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
## 2018-04-04 NOTE — Transfer of Care (Signed)
Immediate Anesthesia Transfer of Care Note  Patient: Sharlotte Alamoerence Hammers  Procedure(s) Performed: POSTERIOR LUMBAR INTERBODY FUSION LUMBAR THREE-LUMBAR FOUR, LUMBAR FOUR- LUMBAR FIVE,LUMBAR FIVE-SACRAL ONE, INTERBODY PROSTHESIS, POSTERIOR INSTRUMENTATION (N/A Spine Lumbar)  Patient Location: PACU  Anesthesia Type:General  Level of Consciousness: awake and patient cooperative  Airway & Oxygen Therapy: Patient Spontanous Breathing and Patient connected to nasal cannula oxygen  Post-op Assessment: Report given to RN, Post -op Vital signs reviewed and stable and Patient moving all extremities  Post vital signs: Reviewed and stable  Last Vitals:  Vitals Value Taken Time  BP 167/97 04/04/2018  5:56 PM  Temp 36.5 C 04/04/2018  5:56 PM  Pulse 116 04/04/2018  5:59 PM  Resp 18 04/04/2018  5:59 PM  SpO2 99 % 04/04/2018  5:59 PM  Vitals shown include unvalidated device data.  Last Pain:  Vitals:   04/04/18 0923  TempSrc:   PainSc: 4       Patients Stated Pain Goal: 4 (04/04/18 [id number])  Complications: No apparent anesthesia complications

## 2018-04-04 NOTE — Progress Notes (Signed)
Patient's blood pressure 148/100.  Dr. Chilton SiGreen is aware.

## 2018-04-04 NOTE — Progress Notes (Signed)
Subjective: The patient is alert and pleasant.  He is in no apparent distress.  He looks well.  Objective: Vital signs in last 24 hours: Temp:  [97.7 F (36.5 C)-97.8 F (36.6 C)] 97.7 F (36.5 C) (12/12 1756) Pulse Rate:  [90-130] 107 (12/12 1811) Resp:  [11-18] 17 (12/12 1811) BP: (143-172)/(91-102) 143/91 (12/12 1811) SpO2:  [96 %-100 %] 100 % (12/12 1811) Weight:  [81.6 kg] 81.6 kg (12/12 0817) Estimated body mass index is 25.83 kg/m as calculated from the following:   Height as of this encounter: 5\' 10"  (1.778 m).   Weight as of this encounter: 81.6 kg.   Intake/Output from previous day: No intake/output data recorded. Intake/Output this shift: Total I/O In: 3060 [I.V.:2650; Blood:160; IV Piggyback:250] Out: 645 [Urine:245; Blood:400]  Physical exam the patient is alert and pleasant.  He is moving his lower extremities well.  Lab Results: No results for input(s): WBC, HGB, HCT, PLT in the last 72 hours. BMET No results for input(s): NA, K, CL, CO2, GLUCOSE, BUN, CREATININE, CALCIUM in the last 72 hours.  Studies/Results: Dg Lumbar Spine 2-3 Views  Result Date: 04/04/2018 CLINICAL DATA:  Surgical posterior fusion of L3-4, L4-5 and L5-S1. EXAM: DG C-ARM 61-120 MIN; LUMBAR SPINE - 2-3 VIEW FLUOROSCOPY TIME:  44 seconds. COMPARISON:  None. FINDINGS: Two intraoperative fluoroscopic images of the lower lumbar spine were obtained. These demonstrate the patient be status post surgical posterior fusion of L3-4, L4-5 and L5-S1 bilateral intrapedicular screw placement and interbody fusion. Good alignment of vertebral bodies is noted. IMPRESSION: Status post surgical posterior fusion as described above. Electronically Signed   By: Lupita Raider, M.D.   On: 04/04/2018 17:23   Dg Lumbar Spine 1 View  Result Date: 04/04/2018 CLINICAL DATA:  Lumbar localization for L3-S1 fusion EXAM: LUMBAR SPINE - 1 VIEW COMPARISON:  Lumbar spine film from earlier today FINDINGS: Film #2 from the  operating room shows anl instrument directed toward the L3-4 interspace posteriorly for localization. IMPRESSION: Instrument directed toward L3-4 posteriorly. Electronically Signed   By: Dwyane Dee M.D.   On: 04/04/2018 16:40   Dg Lumbar Spine 1 View  Result Date: 04/04/2018 CLINICAL DATA:  L3-4, L4-5 and L5-S1 PLIF. Intraoperative localization. EXAM: LUMBAR SPINE - 1 VIEW COMPARISON:  01/29/2018 and earlier, including MRI lumbar spine 04/25/2017. FINDINGS: Previous examinations demonstrated 5 non-rib-bearing lumbar vertebrae. Localization device POSTERIOR to the L5-S1 disc space. IMPRESSION: L5-S1 localized intraoperatively. Electronically Signed   By: Hulan Saas M.D.   On: 04/04/2018 12:09   Dg C-arm 1-60 Min  Result Date: 04/04/2018 CLINICAL DATA:  Surgical posterior fusion of L3-4, L4-5 and L5-S1. EXAM: DG C-ARM 61-120 MIN; LUMBAR SPINE - 2-3 VIEW FLUOROSCOPY TIME:  44 seconds. COMPARISON:  None. FINDINGS: Two intraoperative fluoroscopic images of the lower lumbar spine were obtained. These demonstrate the patient be status post surgical posterior fusion of L3-4, L4-5 and L5-S1 bilateral intrapedicular screw placement and interbody fusion. Good alignment of vertebral bodies is noted. IMPRESSION: Status post surgical posterior fusion as described above. Electronically Signed   By: Lupita Raider, M.D.   On: 04/04/2018 17:23   Dg C-arm 1-60 Min  Result Date: 04/04/2018 CLINICAL DATA:  Surgical posterior fusion of L3-4, L4-5 and L5-S1. EXAM: DG C-ARM 61-120 MIN; LUMBAR SPINE - 2-3 VIEW FLUOROSCOPY TIME:  44 seconds. COMPARISON:  None. FINDINGS: Two intraoperative fluoroscopic images of the lower lumbar spine were obtained. These demonstrate the patient be status post surgical posterior fusion of L3-4, L4-5  and L5-S1 bilateral intrapedicular screw placement and interbody fusion. Good alignment of vertebral bodies is noted. IMPRESSION: Status post surgical posterior fusion as described above.  Electronically Signed   By: Lupita RaiderJames  Green Jr, M.D.   On: 04/04/2018 17:23    Assessment/Plan: The patient is doing well.  I spoke with his family.  LOS: 0 days     Cristi LoronJeffrey D Chantalle Defilippo 04/04/2018, 6:22 PM

## 2018-04-04 NOTE — Op Note (Signed)
Brief history: The patient is a 52 year old black male who is had a previous multilevel laminectomy by another physician.  He has had persistent and worsening back and leg pain.  He has failed medical management.  He was worked up with lumbar x-rays and a lumbar MRI which demonstrated lumbar degenerative disc disease, stenosis, spondylolisthesis, etc.  I discussed the various treatment options.  The patient has weighed the risks, benefits, and alternatives to surgery and decided proceed with a 3 level lumbar decompression, instrumentation and fusion.  Preoperative diagnosis: L3-4 and L5-S1 degenerative disc disease, L4-5 spondylolisthesis, spinal stenosis compressing the L4, L5 and S1  nerve roots; lumbago; lumbar radiculopathy; neurogenic claudication  Postoperative diagnosis: The same  Procedure: Bilateral redo L3-4, L4-5 and L5-S1 laminectomy/laminotomy/foraminotomies/medial facetectomy to decompress the bilateral L4, L5 and S1 nerve roots(the work required to do this was in addition to the work required to do the posterior lumbar interbody fusion because of the patient's spinal stenosis, facet arthropathy. Etc. requiring a wide decompression of the nerve roots.);  L4-5 and L5-S1 transforaminal lumbar interbody fusion, L3-4 posterior lumbar interbody fusion with local morselized autograft bone and Kinnex graft extender; insertion of interbody prosthesis at L3-4, L4-5 and L5-S1 (globus peek expandable interbody prosthesis); posterior segmental instrumentation from L3 to S1 with globus titanium pedicle screws and rods; posterior lateral arthrodesis at L3-4, L4-5 and L5-S1 with local morselized autograft bone and Kinnex bone graft extender.  Surgeon: Dr. Delma Officer  Asst.: Dr. Maeola Harman and Hildred Priest nurse practitioner  Anesthesia: Dicky Doe. endotracheal  Estimated blood loss: 400 cc  Drains: One medium Hemovac in the epidural space  Complications: None  Description of procedure: The patient  was brought to the operating room by the anesthesia team. General endotracheal anesthesia was induced. The patient was turned to the prone position on the Wilson frame. The patient's lumbosacral region was then prepared with Betadine scrub and Betadine solution. Sterile drapes were applied.  I then injected the area to be incised with Marcaine with epinephrine solution. I then used the scalpel to make a linear midline incision over the L3-4, L4-5 and L5-S1 interspace, incising through the old surgical scar. I then used electrocautery to perform a bilateral subperiosteal dissection exposing the remaining spinous process of L3 and remaining lamina of L3-4, L4-5 and L5-S1. We then obtained intraoperative radiograph to confirm our location. We then inserted the Verstrac retractor to provide exposure.  I began the decompression by using the high speed drill to perform redo laminotomies at L3-4, L4-5 and L5-S1 bilaterally.  We encountered quite a bit of scar tissue.. We then used the Kerrison punches to widen the laminotomy and removed the scar tissue and remaining ligamentum flavum at L3-4, L4-5 and L5-S1. We used the Kerrison punches to remove the medial facets at L3-4, L4-5 and L5-S1 bilaterally. We performed wide foraminotomies about the bilateral L4, L5 and S1 nerve roots completing the decompression.  We now turned our attention to the posterior lumbar interbody fusion. I used a scalpel to incise the intervertebral disc at L3-4, L4-5 and L5-S1 bilaterally. I then performed a partial intervertebral discectomy at L3-4, L4-5 and L5-S1 bilaterally using the pituitary forceps. We prepared the vertebral endplates at L3-4, L4-5 and L5-S1 bilaterally for the fusion by removing the soft tissues with the curettes. We then used the trial spacers to pick the appropriate sized interbody prosthesis. We prefilled his prosthesis with a combination of local morselized autograft bone that we obtained during the decompression as  well as  Kinnex bone graft extender. We inserted the prefilled prosthesis into the interspace at L4-5 and L5-S1, we then turned and expanded the prosthesis.  We placed plate spacers bilaterally at L3-4.  There was a good snug fit of the prosthesis in the interspace. We then filled and the remainder of the intervertebral disc space with local morselized autograft bone and Kinnex. This completed the posterior lumbar interbody arthrodesis.  We now turned attention to the instrumentation. Under fluoroscopic guidance we cannulated the bilateral L3, L4, L5 and S1 pedicles with the bone probe. We then removed the bone probe. We then tapped the pedicle with a 6.5 millimeter tap. We then removed the tap. We probed inside the tapped pedicle with a ball probe to rule out cortical breaches. We then inserted a 7.5 x 50 and 55 millimeter pedicle screw into the L3, L4, L5 and S1 pedicles bilaterally under fluoroscopic guidance. We then palpated along the medial aspect of the pedicles to rule out cortical breaches. There were none. The nerve roots were not injured. We then connected the unilateral pedicle screws with a lordotic rod. We compressed the construct and secured the rod in place with the caps.  We placed a cross connector between the rods.  We then tightened the caps appropriately. This completed the instrumentation from L3-S1 bilaterally.  We now turned our attention to the posterior lateral arthrodesis at L3-4, L4-5 and L5-S1 bilaterally. We used the high-speed drill to decorticate the remainder of the facets, pars, transverse process at L3-4, L4-5 and L5-S1 bilaterally. We then applied a combination of local morselized autograft bone and Kinnex bone graft extender over these decorticated posterior lateral structures. This completed the posterior lateral arthrodesis.  We then obtained hemostasis using bipolar electrocautery. We irrigated the wound out with bacitracin solution. We inspected the thecal sac and nerve  roots and noted they were well decompressed. We then removed the retractor. We placed vancomycin powder in the wound.  We injected Exparel .  We placed a medium Hemovac drain in the epidural space and tunneled it out through a separate stab wound.  We reapproximated patient's thoracolumbar fascia with interrupted #1 Vicryl suture. We reapproximated patient's subcutaneous tissue with interrupted 2-0 Vicryl suture. The reapproximated patient's skin with Steri-Strips and benzoin. The wound was then coated with bacitracin ointment. A sterile dressing was applied. The drapes were removed. The patient was subsequently returned to the supine position where they were extubated by the anesthesia team. He was then transported to the post anesthesia care unit in stable condition. All sponge instrument and needle counts were reportedly correct at the end of this case.

## 2018-04-04 NOTE — Anesthesia Procedure Notes (Signed)
Procedure Name: Intubation Date/Time: 04/04/2018 11:01 AM Performed by: Moshe Salisbury, CRNA Pre-anesthesia Checklist: Patient identified, Emergency Drugs available, Suction available and Patient being monitored Patient Re-evaluated:Patient Re-evaluated prior to induction Oxygen Delivery Method: Circle System Utilized Preoxygenation: Pre-oxygenation with 100% oxygen Induction Type: IV induction Ventilation: Mask ventilation without difficulty Laryngoscope Size: Mac and 4 Grade View: Grade II Tube type: Oral Tube size: 8.0 mm Number of attempts: 1 Airway Equipment and Method: Stylet Placement Confirmation: ETT inserted through vocal cords under direct vision,  positive ETCO2 and breath sounds checked- equal and bilateral Secured at: 22 cm Tube secured with: Tape Dental Injury: Teeth and Oropharynx as per pre-operative assessment

## 2018-04-05 LAB — CBC
HEMATOCRIT: 37.5 % — AB (ref 39.0–52.0)
Hemoglobin: 12.1 g/dL — ABNORMAL LOW (ref 13.0–17.0)
MCH: 28.5 pg (ref 26.0–34.0)
MCHC: 32.3 g/dL (ref 30.0–36.0)
MCV: 88.2 fL (ref 80.0–100.0)
Platelets: 260 10*3/uL (ref 150–400)
RBC: 4.25 MIL/uL (ref 4.22–5.81)
RDW: 13.2 % (ref 11.5–15.5)
WBC: 10 10*3/uL (ref 4.0–10.5)
nRBC: 0 % (ref 0.0–0.2)

## 2018-04-05 LAB — BASIC METABOLIC PANEL
Anion gap: 11 (ref 5–15)
BUN: 17 mg/dL (ref 6–20)
CO2: 27 mmol/L (ref 22–32)
Calcium: 8.8 mg/dL — ABNORMAL LOW (ref 8.9–10.3)
Chloride: 97 mmol/L — ABNORMAL LOW (ref 98–111)
Creatinine, Ser: 1.21 mg/dL (ref 0.61–1.24)
GFR calc Af Amer: >60 mL/min (ref >60)
GFR calc non Af Amer: >60 mL/min (ref >60)
Glucose, Bld: 113 mg/dL — ABNORMAL HIGH (ref 70–99)
Potassium: 4.3 mmol/L (ref 3.5–5.1)
Sodium: 135 mmol/L (ref 135–145)

## 2018-04-05 NOTE — Progress Notes (Signed)
Orthopedic Tech Progress Note Patient Details:  Gary Luna 1965-08-23 [id number]  Patient ID: Gary Luna, male   DOB: 1965-08-23, 52 y.o.   MRN: [id number] rn says pt has brace.  Trinna PostMartinez, Tyri Elmore J 04/05/2018, 9:06 AM

## 2018-04-05 NOTE — Progress Notes (Signed)
Subjective:  The patient is alert and pleasant.  His family is at the bedside.  He looks well.  His back is appropriately sore.  Objective: Vital signs in last 24 hours: Temp:  [97.7 F (36.5 C)-98.7 F (37.1 C)] 98.7 F (37.1 C) (12/13 0400) Pulse Rate:  [99-130] 99 (12/12 2005) Resp:  [10-19] 18 (12/12 1935) BP: (110-167)/(64-101) 115/68 (12/13 0400) SpO2:  [96 %-100 %] 99 % (12/13 0000) Estimated body mass index is 25.83 kg/m as calculated from the following:   Height as of this encounter: 5\' 10"  (1.778 m).   Weight as of this encounter: 81.6 kg.   Intake/Output from previous day: 12/12 0701 - 12/13 0700 In: 3260 [I.V.:2850; Blood:160; IV Piggyback:250] Out: 1445 [Urine:545; Drains:500; Blood:400] Intake/Output this shift: No intake/output data recorded.  Physical exam   The patient is alert and oriented.  His dressing is clean and dry.  His strength is normal.  Lab Results: Recent Labs   04/05/18 0446 WBC 10.0 HGB 12.1* HCT 37.5* PLT 260  BMET Recent Labs   04/05/18 0446 NA 135 K 4.3 CL 97* CO2 27 GLUCOSE 113* BUN 17 CREATININE 1.21 CALCIUM 8.8*   Studies/Results: Dg Lumbar Spine 2-3 Views  Result Date: 04/04/2018 CLINICAL DATA:  Surgical posterior fusion of L3-4, L4-5 and L5-S1. EXAM: DG C-ARM 61-120 MIN; LUMBAR SPINE - 2-3 VIEW FLUOROSCOPY TIME:  44 seconds. COMPARISON:  None. FINDINGS: Two intraoperative fluoroscopic images of the lower lumbar spine were obtained. These demonstrate the patient be status post surgical posterior fusion of L3-4, L4-5 and L5-S1 bilateral intrapedicular screw placement and interbody fusion. Good alignment of vertebral bodies is noted. IMPRESSION: Status post surgical posterior fusion as described above. Electronically Signed   By: Lupita RaiderJames  Green Jr, M.D.   On: 04/04/2018 17:23   Dg Lumbar Spine 1 View  Result Date: 04/04/2018 CLINICAL DATA:  Lumbar localization for L3-S1 fusion EXAM: LUMBAR SPINE - 1 VIEW COMPARISON:   Lumbar spine film from earlier today FINDINGS: Film #2 from the operating room shows anl instrument directed toward the L3-4 interspace posteriorly for localization. IMPRESSION: Instrument directed toward L3-4 posteriorly. Electronically Signed   By: Dwyane DeePaul  Barry M.D.   On: 04/04/2018 16:40   Dg Lumbar Spine 1 View  Result Date: 04/04/2018 CLINICAL DATA:  L3-4, L4-5 and L5-S1 PLIF. Intraoperative localization. EXAM: LUMBAR SPINE - 1 VIEW COMPARISON:  01/29/2018 and earlier, including MRI lumbar spine 04/25/2017. FINDINGS: Previous examinations demonstrated 5 non-rib-bearing lumbar vertebrae. Localization device POSTERIOR to the L5-S1 disc space. IMPRESSION: L5-S1 localized intraoperatively. Electronically Signed   By: Hulan Saashomas  Lawrence M.D.   On: 04/04/2018 12:09   Dg C-arm 1-60 Min  Result Date: 04/04/2018 CLINICAL DATA:  Surgical posterior fusion of L3-4, L4-5 and L5-S1. EXAM: DG C-ARM 61-120 MIN; LUMBAR SPINE - 2-3 VIEW FLUOROSCOPY TIME:  44 seconds. COMPARISON:  None. FINDINGS: Two intraoperative fluoroscopic images of the lower lumbar spine were obtained. These demonstrate the patient be status post surgical posterior fusion of L3-4, L4-5 and L5-S1 bilateral intrapedicular screw placement and interbody fusion. Good alignment of vertebral bodies is noted. IMPRESSION: Status post surgical posterior fusion as described above. Electronically Signed   By: Lupita RaiderJames  Green Jr, M.D.   On: 04/04/2018 17:23   Dg C-arm 1-60 Min  Result Date: 04/04/2018 CLINICAL DATA:  Surgical posterior fusion of L3-4, L4-5 and L5-S1. EXAM: DG C-ARM 61-120 MIN; LUMBAR SPINE - 2-3 VIEW FLUOROSCOPY TIME:  44 seconds. COMPARISON:  None. FINDINGS: Two intraoperative fluoroscopic images of the lower  lumbar spine were obtained. These demonstrate the patient be status post surgical posterior fusion of L3-4, L4-5 and L5-S1 bilateral intrapedicular screw placement and interbody fusion. Good alignment of vertebral bodies is noted.  IMPRESSION: Status post surgical posterior fusion as described above. Electronically Signed   By: Lupita Raider, M.D.   On: 04/04/2018 17:23    Assessment/Plan:   Postop day 1.:  The patient is doing well.  We will mobilize him with physical therapy.  He will likely go home tomorrow.  I gave him his discharge instructions and answered all his questions.  LOS: 1 day     Cristi Loron 04/05/2018, 8:59 AM

## 2018-04-05 NOTE — Evaluation (Signed)
Physical Therapy Evaluation Patient Details Name: Gary Luna MRN: [id number] DOB: 11/15/1965 Today's Date: 04/05/2018   History of Present Illness  52 yo admitted for L3-S1 PLIF. PMHx: back sx, arthritis, DM, HTN  Clinical Impression  Pt pleasant and able to perform basic mobility and gait without physical assist. Min assist to don brace with education for all transfers, precautions and mobility provided. Pt moving well and safe for return home with assist of family/friends. Will follow acutely to maximize independence and adherence to precautions.      Follow Up Recommendations No PT follow up    Equipment Recommendations  None recommended by PT    Recommendations for Other Services       Precautions / Restrictions Precautions Precautions: Back Precaution Booklet Issued: Yes (comment) Required Braces or Orthoses: Spinal Brace Spinal Brace: Lumbar corset;Applied in sitting position      Mobility  Bed Mobility Overal bed mobility: Needs Assistance Bed Mobility: Rolling;Sidelying to Sit Rolling: Supervision Sidelying to sit: Supervision       General bed mobility comments: cues for sequence  Transfers Overall transfer level: Needs assistance   Transfers: Sit to/from Stand Sit to Stand: Supervision         General transfer comment: cues for posture  Ambulation/Gait Ambulation/Gait assistance: Modified independent (Device/Increase time) Gait Distance (Feet): 400 Feet Assistive device: None Gait Pattern/deviations: Step-through pattern;Decreased stride length   Gait velocity interpretation: >2.62 ft/sec, indicative of community ambulatory General Gait Details: pt with good stability with gait without LOB  Stairs Stairs: Yes Stairs assistance: Modified independent (Device/Increase time) Stair Management: Alternating pattern;Forwards;One rail Left Number of Stairs: 11 General stair comments: safe and demonstrates good stability with flight of  stairs  Wheelchair Mobility    Modified Rankin (Stroke Patients Only)       Balance Overall balance assessment: No apparent balance deficits (not formally assessed)                                           Pertinent Vitals/Pain Pain Assessment: 0-10 Pain Score: 2  Pain Location: incision Pain Descriptors / Indicators: Aching Pain Intervention(s): Limited activity within patient's tolerance;Monitored during session;Repositioned    Home Living Family/patient expects to be discharged to:: Private residence Living Arrangements: Alone Available Help at Discharge: Family;Friend(s);Available 24 hours/day Type of Home: Other(Comment)(townhouse) Home Access: Stairs to enter   Entrance Stairs-Number of Steps: 1 Home Layout: Two level;Bed/bath upstairs Home Equipment: Cane - single point      Prior Function Level of Independence: Independent         Comments: works in Clinical biochemistcustomer service for [employer]     Hand Dominance        Extremity/Trunk Assessment   Upper Extremity Assessment Upper Extremity Assessment: Overall WFL for tasks assessed    Lower Extremity Assessment Lower Extremity Assessment: Overall WFL for tasks assessed    Cervical / Trunk Assessment Cervical / Trunk Assessment: Other exceptions Cervical / Trunk Exceptions: post surgical  Communication   Communication: No difficulties  Cognition Arousal/Alertness: Awake/alert Behavior During Therapy: WFL for tasks assessed/performed Overall Cognitive Status: Within Functional Limits for tasks assessed                                        General Comments      Exercises  Assessment/Plan    PT Assessment Patient needs continued PT services  PT Problem List Decreased activity tolerance;Decreased knowledge of precautions;Decreased mobility;Pain       PT Treatment Interventions Functional mobility training;Gait training;Patient/family  education;Therapeutic activities    PT Goals (Current goals can be found in the Care Plan section)  Acute Rehab PT Goals Patient Stated Goal: return to work, bike at [employer] park PT Goal Formulation: With patient Time For Goal Achievement: 04/12/18 Potential to Achieve Goals: Good    Frequency Min 5X/week   Barriers to discharge        Co-evaluation               AM-PAC PT "6 Clicks" Mobility  Outcome Measure Help needed turning from your back to your side while in a flat bed without using bedrails?: None Help needed moving from lying on your back to sitting on the side of a flat bed without using bedrails?: None Help needed moving to and from a bed to a chair (including a wheelchair)?: None Help needed standing up from a chair using your arms (e.g., wheelchair or bedside chair)?: None Help needed to walk in hospital room?: None Help needed climbing 3-5 steps with a railing? : None 6 Click Score: 24    End of Session Equipment Utilized During Treatment: Back brace Activity Tolerance: Patient tolerated treatment well Patient left: in chair;with call bell/phone within reach;with family/visitor present;with nursing/sitter in room Nurse Communication: Mobility status;Precautions PT Visit Diagnosis: Other abnormalities of gait and mobility (R26.89)    Time: 1610-9604 PT Time Calculation (min) (ACUTE ONLY): 21 min   Charges:   PT Evaluation $PT Eval Moderate Complexity: 1 Mod          Gary Luna, PT Acute Rehabilitation Services Pager: [phone] Office: [phone]   Gary Luna 04/05/2018, 9:15 AM

## 2018-04-05 NOTE — Evaluation (Signed)
Occupational Therapy Evaluation Patient Details Name: Gary Luna MRN: [id number] DOB: 1966/04/02 Today's Date: 04/05/2018    History of Present Illness 52 yo admitted for L3-S1 PLIF. PMHx: back sx, arthritis, DM, HTN   Clinical Impression   Pt is at sup level with ADL mobility/transefrs with without using an AD.Pt min A with LB ADLs due to back precautions. Pt competent with donning and back brace and was provided education on ADL A/E with handout. Pt will have assist at home from family and significant other. All education completed and no further acute OT services indicated at this time    Follow Up Recommendations  No OT follow up;Supervision - Intermittent    Equipment Recommendations  Tub/shower seat(ADL A/E kit)    Recommendations for Other Services       Precautions / Restrictions Precautions Precautions: Back Precaution Booklet Issued: Yes (comment) Precaution Comments: pt able to recall all back precautions Required Braces or Orthoses: Spinal Brace Spinal Brace: Lumbar corset;Applied in sitting position Restrictions Weight Bearing Restrictions: No      Mobility Bed Mobility Overal bed mobility: Needs Assistance Bed Mobility: Rolling;Sidelying to Sit Rolling: Supervision Sidelying to sit: Supervision       General bed mobility comments: correct log roll technique  Transfers Overall transfer level: Needs assistance Equipment used: 1 person hand held assist;None Transfers: Sit to/from Stand Sit to Stand: Supervision         General transfer comment: cues for posture    Balance Overall balance assessment: No apparent balance deficits (not formally assessed)                                         ADL either performed or assessed with clinical judgement   ADL Overall ADL's : Needs assistance/impaired Eating/Feeding: Independent;Sitting   Grooming: Wash/dry hands;Wash/dry face;Standing;Supervision/safety   Upper Body Bathing:  Set up;Supervision/ safety;Sitting   Lower Body Bathing: Minimal assistance   Upper Body Dressing : Supervision/safety;Set up;Sitting   Lower Body Dressing: Minimal assistance   Toilet Transfer: Supervision/safety;Ambulation;Grab bars;Comfort height toilet   Toileting- Clothing Manipulation and Hygiene: Min guard;Sit to/from stand   Tub/ Shower Transfer: Supervision/safety;Ambulation;Grab bars;3 in 1   Functional mobility during ADLs: Supervision/safety General ADL Comments: pt is familiar with ADL A/E, had reacher at home. Reviewed other A/E with pt and provided handout     Vision Patient Visual Report: No change from baseline       Perception     Praxis      Pertinent Vitals/Pain Pain Assessment: 0-10 Pain Score: 3  Pain Location: back incision Pain Descriptors / Indicators: Aching;Sore Pain Intervention(s): Monitored during session;Repositioned;RN gave pain meds during session     Hand Dominance Right   Extremity/Trunk Assessment Upper Extremity Assessment Upper Extremity Assessment: Overall WFL for tasks assessed   Lower Extremity Assessment Lower Extremity Assessment: Defer to PT evaluation   Cervical / Trunk Assessment Cervical / Trunk Assessment: Other exceptions Cervical / Trunk Exceptions: post surgical   Communication Communication Communication: No difficulties   Cognition Arousal/Alertness: Awake/alert Behavior During Therapy: WFL for tasks assessed/performed Overall Cognitive Status: Within Functional Limits for tasks assessed                                     General Comments       Exercises  Shoulder Instructions      Home Living Family/patient expects to be discharged to:: Private residence Living Arrangements: Alone Available Help at Discharge: Family;Friend(s);Available 24 hours/day Type of Home: Other(Comment)(townhome) Home Access: Stairs to enter Entrance Stairs-Number of Steps: 1   Home Layout: Two  level;Bed/bath upstairs Alternate Level Stairs-Number of Steps: 14 Alternate Level Stairs-Rails: Left Bathroom Shower/Tub: Teacher, early years/pre: Standard     Home Equipment: Cane - single point;Adaptive equipment Adaptive Equipment: Reacher        Prior Functioning/Environment Level of Independence: Independent        Comments: works in Therapist, art for [employer]        OT Problem List: Decreased activity tolerance;Pain;Decreased knowledge of use of DME or AE      OT Treatment/Interventions:      OT Goals(Current goals can be found in the care plan section) Acute Rehab OT Goals Patient Stated Goal: return to work, bike at [place] OT Goal Formulation: All assessment and education complete, DC therapy  OT Frequency:     Barriers to D/C:    no barriers       Co-evaluation              AM-PAC OT "6 Clicks" Daily Activity     Outcome Measure Help from another person eating meals?: None Help from another person taking care of personal grooming?: None Help from another person toileting, which includes using toliet, bedpan, or urinal?: A Little Help from another person bathing (including washing, rinsing, drying)?: A Little Help from another person to put on and taking off regular upper body clothing?: None Help from another person to put on and taking off regular lower body clothing?: A Little 6 Click Score: 21   End of Session Equipment Utilized During Treatment: Gait belt;Back brace  Activity Tolerance: Patient tolerated treatment well Patient left: in chair;with call bell/phone within reach  OT Visit Diagnosis: Pain;Other abnormalities of gait and mobility (R26.89) Pain - part of body: (back)                Time: 4628-6381 OT Time Calculation (min): 29 min Charges:  OT General Charges $OT Visit: 1 Visit OT Evaluation $OT Eval Moderate Complexity: 1 Mod OT Treatments $Therapeutic Activity: 8-22 mins    Britt Bottom 04/05/2018, 12:35 PM

## 2018-04-06 ENCOUNTER — Encounter (HOSPITAL_COMMUNITY): Payer: Self-pay | Admitting: *Deleted

## 2018-04-06 ENCOUNTER — Inpatient Hospital Stay (HOSPITAL_COMMUNITY): Payer: 59

## 2018-04-06 ENCOUNTER — Encounter (HOSPITAL_COMMUNITY)
Admission: RE | Disposition: A | Discharge status: Home / Self Care | Payer: Self-pay | Source: Home / Self Care | Attending: Neurosurgery

## 2018-04-06 ENCOUNTER — Inpatient Hospital Stay (HOSPITAL_COMMUNITY): Payer: 59 | Admitting: Certified Registered Nurse Anesthetist

## 2018-04-06 HISTORY — PX: LUMBAR WOUND DEBRIDEMENT: SHX1988

## 2018-04-06 SURGERY — LUMBAR WOUND DEBRIDEMENT
Anesthesia: General | Site: Spine Lumbar

## 2018-04-06 MED ORDER — HYDROMORPHONE HCL 1 MG/ML IJ SOLN
0.2500 mg | INTRAMUSCULAR | Status: DC | PRN
  Administered 2018-04-06 (×2): 0.25 mg via INTRAVENOUS
  Administered 2018-04-06: 0.5 mg via INTRAVENOUS

## 2018-04-06 MED ORDER — 0.9 % SODIUM CHLORIDE (POUR BTL) OPTIME
TOPICAL | Status: DC | PRN
  Administered 2018-04-06: 1000 mL

## 2018-04-06 MED ORDER — PROMETHAZINE HCL 25 MG/ML IJ SOLN: 6.2500 mg | INTRAMUSCULAR | Status: DC | PRN

## 2018-04-06 MED ORDER — FENTANYL CITRATE (PF) 250 MCG/5ML IJ SOLN
INTRAMUSCULAR | Status: AC
  Filled 2018-04-06: qty 5

## 2018-04-06 MED ORDER — LABETALOL HCL 5 MG/ML IV SOLN
INTRAVENOUS | Status: AC
  Administered 2018-04-06: 5 mg via INTRAVENOUS
  Filled 2018-04-06: qty 4

## 2018-04-06 MED ORDER — SODIUM CHLORIDE 0.9 % IV SOLN
INTRAVENOUS | Status: DC | PRN
  Administered 2018-04-06: 11:00:00

## 2018-04-06 MED ORDER — LIDOCAINE-EPINEPHRINE 1 %-1:100000 IJ SOLN
INTRAMUSCULAR | Status: AC
  Filled 2018-04-06: qty 1

## 2018-04-06 MED ORDER — LIDOCAINE 2% (20 MG/ML) 5 ML SYRINGE
INTRAMUSCULAR | Status: DC | PRN
  Administered 2018-04-06: 60 mg via INTRAVENOUS

## 2018-04-06 MED ORDER — THROMBIN 5000 UNITS EX SOLR
OROMUCOSAL | Status: DC | PRN
  Administered 2018-04-06: 11:00:00 via TOPICAL

## 2018-04-06 MED ORDER — HYDROMORPHONE HCL 1 MG/ML IJ SOLN
INTRAMUSCULAR | Status: AC
  Administered 2018-04-06: 0.25 mg via INTRAVENOUS
  Filled 2018-04-06: qty 1

## 2018-04-06 MED ORDER — CEFAZOLIN SODIUM-DEXTROSE 2-3 GM-%(50ML) IV SOLR
INTRAVENOUS | Status: DC | PRN
  Administered 2018-04-06: 2 g via INTRAVENOUS

## 2018-04-06 MED ORDER — CEFAZOLIN SODIUM-DEXTROSE 2-4 GM/100ML-% IV SOLN: 2.0000 g | Freq: Once | INTRAVENOUS | Status: DC

## 2018-04-06 MED ORDER — MIDAZOLAM HCL 2 MG/2ML IJ SOLN
INTRAMUSCULAR | Status: AC
  Filled 2018-04-06: qty 2

## 2018-04-06 MED ORDER — DEXAMETHASONE SODIUM PHOSPHATE 10 MG/ML IJ SOLN
INTRAMUSCULAR | Status: DC | PRN
  Administered 2018-04-06: 10 mg via INTRAVENOUS

## 2018-04-06 MED ORDER — CEFAZOLIN SODIUM-DEXTROSE 2-4 GM/100ML-% IV SOLN
INTRAVENOUS | Status: AC
  Filled 2018-04-06: qty 100

## 2018-04-06 MED ORDER — PROPOFOL 10 MG/ML IV BOLUS
INTRAVENOUS | Status: AC
  Filled 2018-04-06: qty 20

## 2018-04-06 MED ORDER — THROMBIN 5000 UNITS EX SOLR
CUTANEOUS | Status: AC
  Filled 2018-04-06: qty 5000

## 2018-04-06 MED ORDER — PROPOFOL 10 MG/ML IV BOLUS
INTRAVENOUS | Status: DC | PRN
  Administered 2018-04-06: 90 mg via INTRAVENOUS

## 2018-04-06 MED ORDER — OXYCODONE HCL 5 MG/5ML PO SOLN: 5.0000 mg | Freq: Once | ORAL | Status: DC | PRN

## 2018-04-06 MED ORDER — LABETALOL HCL 5 MG/ML IV SOLN
5.0000 mg | INTRAVENOUS | Status: DC | PRN
  Administered 2018-04-06 (×2): 5 mg via INTRAVENOUS

## 2018-04-06 MED ORDER — PHENYLEPHRINE HCL 10 MG/ML IJ SOLN
INTRAMUSCULAR | Status: DC | PRN
  Administered 2018-04-06: 80 ug via INTRAVENOUS

## 2018-04-06 MED ORDER — MIDAZOLAM HCL 2 MG/2ML IJ SOLN
INTRAMUSCULAR | Status: DC | PRN
  Administered 2018-04-06 (×2): 1 mg via INTRAVENOUS

## 2018-04-06 MED ORDER — SUGAMMADEX SODIUM 200 MG/2ML IV SOLN
INTRAVENOUS | Status: DC | PRN
  Administered 2018-04-06: 200 mg via INTRAVENOUS

## 2018-04-06 MED ORDER — ROCURONIUM BROMIDE 10 MG/ML (PF) SYRINGE
PREFILLED_SYRINGE | INTRAVENOUS | Status: DC | PRN
  Administered 2018-04-06: 50 mg via INTRAVENOUS

## 2018-04-06 MED ORDER — ONDANSETRON HCL 4 MG/2ML IJ SOLN
INTRAMUSCULAR | Status: DC | PRN
  Administered 2018-04-06: 4 mg via INTRAVENOUS

## 2018-04-06 MED ORDER — OXYCODONE HCL 5 MG PO TABS: 5.0000 mg | ORAL_TABLET | Freq: Once | ORAL | Status: DC | PRN

## 2018-04-06 MED ORDER — FENTANYL CITRATE (PF) 250 MCG/5ML IJ SOLN
INTRAMUSCULAR | Status: DC | PRN
  Administered 2018-04-06 (×2): 50 ug via INTRAVENOUS
  Administered 2018-04-06: 150 ug via INTRAVENOUS

## 2018-04-06 SURGICAL SUPPLY — 51 items
BAG DECANTER FOR FLEXI CONT (MISCELLANEOUS) ×3 IMPLANT
BLADE SURG 11 STRL SS (BLADE) ×3 IMPLANT
CANISTER SUCT 3000ML PPV (MISCELLANEOUS) ×3 IMPLANT
COVER WAND RF STERILE (DRAPES) ×3 IMPLANT
DERMABOND ADVANCED (GAUZE/BANDAGES/DRESSINGS) ×1
DERMABOND ADVANCED .7 DNX12 (GAUZE/BANDAGES/DRESSINGS) ×2 IMPLANT
DRAIN SNY 7 FPER (WOUND CARE) ×3 IMPLANT
DRAPE LAPAROTOMY 100X72X124 (DRAPES) ×3 IMPLANT
DRAPE NEUROLOGICAL W/INCISE (DRAPES) ×3 IMPLANT
DRAPE SHEET LG 3/4 BI-LAMINATE (DRAPES) ×3 IMPLANT
DRSG OPSITE POSTOP 4X8 (GAUZE/BANDAGES/DRESSINGS) ×3 IMPLANT
DURAPREP 26ML APPLICATOR (WOUND CARE) ×3 IMPLANT
ELECT REM PT RETURN 9FT ADLT (ELECTROSURGICAL) ×3
ELECTRODE REM PT RTRN 9FT ADLT (ELECTROSURGICAL) ×2 IMPLANT
EVACUATOR SILICONE 100CC (DRAIN) ×3 IMPLANT
FLOSEAL 5ML (HEMOSTASIS) ×3 IMPLANT
GAUZE 4X4 16PLY RFD (DISPOSABLE) IMPLANT
GAUZE SPONGE 4X4 12PLY STRL (GAUZE/BANDAGES/DRESSINGS) ×3 IMPLANT
GLOVE BIO SURGEON STRL SZ7.5 (GLOVE) ×9 IMPLANT
GLOVE BIOGEL PI IND STRL 7.0 (GLOVE) ×2 IMPLANT
GLOVE BIOGEL PI IND STRL 7.5 (GLOVE) ×8 IMPLANT
GLOVE BIOGEL PI INDICATOR 7.0 (GLOVE) ×1
GLOVE BIOGEL PI INDICATOR 7.5 (GLOVE) ×4
GLOVE SS N UNI LF 6.5 STRL (GLOVE) ×6 IMPLANT
GLOVE SS N UNI LF 7.0 STRL (GLOVE) ×6 IMPLANT
GOWN STRL REUS W/ TWL LRG LVL3 (GOWN DISPOSABLE) ×4 IMPLANT
GOWN STRL REUS W/ TWL XL LVL3 (GOWN DISPOSABLE) IMPLANT
GOWN STRL REUS W/TWL 2XL LVL3 (GOWN DISPOSABLE) IMPLANT
GOWN STRL REUS W/TWL LRG LVL3 (GOWN DISPOSABLE) ×2
GOWN STRL REUS W/TWL XL LVL3 (GOWN DISPOSABLE)
HEMOSTAT POWDER KIT SURGIFOAM (HEMOSTASIS) ×6 IMPLANT
KIT BASIN OR (CUSTOM PROCEDURE TRAY) ×3 IMPLANT
KIT TURNOVER KIT B (KITS) ×3 IMPLANT
NEEDLE HYPO 18GX1.5 BLUNT FILL (NEEDLE) IMPLANT
NEEDLE HYPO 22GX1.5 SAFETY (NEEDLE) ×3 IMPLANT
NEEDLE SPNL 18GX3.5 QUINCKE PK (NEEDLE) ×3 IMPLANT
NS IRRIG 1000ML POUR BTL (IV SOLUTION) ×3 IMPLANT
PACK CRANIOTOMY CUSTOM (CUSTOM PROCEDURE TRAY) ×3 IMPLANT
PACK LAMINECTOMY NEURO (CUSTOM PROCEDURE TRAY) ×3 IMPLANT
PAD ARMBOARD 7.5X6 YLW CONV (MISCELLANEOUS) ×9 IMPLANT
SPONGE LAP 4X18 RFD (DISPOSABLE) IMPLANT
SPONGE NEURO XRAY DETECT 1X3 (DISPOSABLE) IMPLANT
STAPLER VISISTAT 35W (STAPLE) ×3 IMPLANT
SUT MNCRL AB 3-0 PS2 18 (SUTURE) ×3 IMPLANT
SUT VIC AB 0 CT1 18XCR BRD8 (SUTURE) ×6 IMPLANT
SUT VIC AB 0 CT1 8-18 (SUTURE) ×3
SUT VIC AB 2-0 CP2 18 (SUTURE) ×6 IMPLANT
SYR 3ML LL SCALE MARK (SYRINGE) IMPLANT
TOWEL GREEN STERILE (TOWEL DISPOSABLE) ×3 IMPLANT
TOWEL GREEN STERILE FF (TOWEL DISPOSABLE) ×3 IMPLANT
WATER STERILE IRR 1000ML POUR (IV SOLUTION) ×3 IMPLANT

## 2018-04-06 NOTE — Progress Notes (Signed)
Labetalol 5mg  IV per orders for BP per Dr Bradley FerrisEllender

## 2018-04-06 NOTE — Op Note (Signed)
PATIENT: Gary Luna  DAY OF SURGERY: 04/06/18   PRE-OPERATIVE DIAGNOSIS:  Lumbar spinal epidural hematoma   POST-OPERATIVE DIAGNOSIS:  Lumbar spinal epidural hematoma   PROCEDURE:  Evacuation of lumbar spinal epidural hematoma   SURGEON:  Surgeon(s) and Role:    Jadene Pierinistergard, Thomas A, MD - Primary   ANESTHESIA: ETGA   BRIEF HISTORY: This is a 52yo man who recently had a repeat lumbar decompression and instrumented fusion with Dr. [name]. He was doing well until yesterday a few hours after his drain was removed. He began having increasing low back pain that progressed overnight. He incision became more firm and he began developing new numbness and parasthesias in the lower extremity. An MRI showed a suprafascial compressive hematoma with some thecal sac compression from a mixed component fluid collection. Given how severe his pain was and the development of new neurologic symptoms, he required operative evacuation of his hematoma.This was discussed with the patient as well as risks, benefits, and alternatives and wished to proceed with surgical evacuation.   OPERATIVE DETAIL: The patient was taken to the operating room and placed on the OR table in the prone position. A formal time out was performed with two patient identifiers and confirmed the operative site. Anesthesia was induced by the anesthesia team. The operative site was marked, hair was clipped with surgical clippers, the area was then prepped and draped in a sterile fashion. The prior midline lumbar incision was opened with immediate egress of seromatous fluid under high pressure. There was some blood staining of the incision walls, but there was not a thick acute clot present. Hemostasis was obtained and the fascia was opened, in which more seromatous fluid was encountered. Hemostasis was again obtained and a #7 flat drain was inserted subfascially, tunneled, and secured.   All instrument and sponge counts were correct, the incision  was then closed in layers. The patient was then returned to anesthesia for emergence. No apparent complications at the completion of the procedure.   EBL:  50mL   DRAINS: #7 flat drain in subfascial space with JP bulb   SPECIMENS: none   Jadene Pierinihomas A Ostergard, MD 04/06/18 1:26 PM

## 2018-04-06 NOTE — Transfer of Care (Signed)
Immediate Anesthesia Transfer of Care Note  Patient: Gary Luna  Procedure(s) Performed: LUMBAR EVACUATION  OF HEMATOMA (N/A Spine Lumbar)  Patient Location: PACU  Anesthesia Type:General  Level of Consciousness: awake, alert  and oriented  Airway & Oxygen Therapy: Patient Spontanous Breathing and Patient connected to nasal cannula oxygen  Post-op Assessment: Report given to RN and Post -op Vital signs reviewed and stable  Post vital signs: Reviewed and stable  Last Vitals:  Vitals Value Taken Time  BP    Temp    Pulse 120 04/06/2018 11:58 AM  Resp 16 04/06/2018 11:58 AM  SpO2 98 % 04/06/2018 11:58 AM  Vitals shown include unvalidated device data.  Last Pain:  Vitals:   04/06/18 0400  TempSrc: Oral  PainSc:       Patients Stated Pain Goal: 4 (04/04/18 [id number])  Complications: No apparent anesthesia complications

## 2018-04-06 NOTE — Anesthesia Procedure Notes (Signed)
Procedure Name: Intubation Date/Time: 04/06/2018 10:40 AM Performed by: Clearnce Sorrel, CRNA Pre-anesthesia Checklist: Patient identified, Emergency Drugs available, Suction available, Patient being monitored and Timeout performed Patient Re-evaluated:Patient Re-evaluated prior to induction Oxygen Delivery Method: Circle system utilized Preoxygenation: Pre-oxygenation with 100% oxygen Induction Type: IV induction Ventilation: Oral airway inserted - appropriate to patient size Laryngoscope Size: Mac and 4 Grade View: Grade II Tube type: Oral Tube size: 7.5 mm Number of attempts: 1 Airway Equipment and Method: Stylet Placement Confirmation: ETT inserted through vocal cords under direct vision,  positive ETCO2 and breath sounds checked- equal and bilateral Secured at: 22 cm Tube secured with: Tape Dental Injury: Teeth and Oropharynx as per pre-operative assessment

## 2018-04-06 NOTE — Progress Notes (Signed)
PT Cancellation Note  Patient Details Name: Gary Luna MRN: [id number] DOB: 1966/03/10   Cancelled Treatment:    Reason Eval/Treat Not Completed: Patient at procedure or test/unavailable(in OR at this time)   Fabio AsaDevon J Shyanna Klingel 04/06/2018, 9:47 AM

## 2018-04-06 NOTE — Progress Notes (Signed)
0210: Pt called RN in stating he has numbness and tingling in both legs that was not there before. Pt described it as pins and needles in both legs. Motor strength still equal. Pt's lower back is more swollen than at beginning of shift. No new drainage on honeycomb or gauze from hemovac removal.   On-call provider paged and STAT MRI order entered.   0400: Pt returned to unit. Vitals taken, no other changes with pt.

## 2018-04-06 NOTE — Progress Notes (Signed)
No consent orders. Patient denies that he is a diabetic. Verified armband matches patient's stated name and birth date. Verified NPO status and that all jewelry, contact, glasses, dentures, and partials had been removed (if applicable).

## 2018-04-06 NOTE — Anesthesia Postprocedure Evaluation (Signed)
Anesthesia Post Note  Patient: Gary Luna  Procedure(s) Performed: LUMBAR EVACUATION  OF HEMATOMA (N/A Spine Lumbar)     Patient location during evaluation: PACU Anesthesia Type: General Level of consciousness: awake Pain management: pain level controlled Vital Signs Assessment: post-procedure vital signs reviewed and stable Respiratory status: spontaneous breathing, nonlabored ventilation, respiratory function stable and patient connected to nasal cannula oxygen Cardiovascular status: blood pressure returned to baseline and stable Postop Assessment: no apparent nausea or vomiting Anesthetic complications: no    Last Vitals:  Vitals:   04/06/18 1437 04/06/18 2022  BP: (!) 201/87 133/74  Pulse:  69  Resp:    Temp:  37.5 C  SpO2:  99%    Last Pain:  Vitals:   04/06/18 2022  TempSrc: Oral  PainSc:                  Catheryn Baconyan P Ellender

## 2018-04-06 NOTE — Brief Op Note (Signed)
04/06/2018  1:25 PM  PATIENT:  Sharlotte Alamoerence Schuermann  52 y.o. male  PRE-OPERATIVE DIAGNOSIS:  Lumbar spinal epidural hematoma  POST-OPERATIVE DIAGNOSIS:  Lumbar spinal epidural hematoma  PROCEDURE:  Procedure(s): LUMBAR EVACUATION  OF HEMATOMA (N/A)  SURGEON:  Surgeon(s) and Role:    * Shilpa Bushee, Clovis Puhomas A, MD - Primary  ASSISTANTS: none   ANESTHESIA:   general  EBL:  25 mL   BLOOD ADMINISTERED:none  DRAINS: (#7 flat drain with JP bulb) Jackson-Pratt drain(s) with closed bulb suction in the subfascial space   LOCAL MEDICATIONS USED:  LIDOCAINE   SPECIMEN:  No Specimen  DISPOSITION OF SPECIMEN:  N/A  COUNTS:  YES  TOURNIQUET:  * No tourniquets in log *  DICTATION: .Note written in EPIC  PLAN OF CARE: Admit to inpatient   PATIENT DISPOSITION:  PACU - hemodynamically stable.   Delay start of Pharmacological VTE agent (>24hrs) due to surgical blood loss or risk of bleeding: yes

## 2018-04-06 NOTE — Progress Notes (Signed)
Consent signed and witnessed

## 2018-04-06 NOTE — Anesthesia Preprocedure Evaluation (Signed)
Anesthesia Evaluation  Patient identified by MRN, date of birth, ID bandGeneral Assessment Comment:Patient sedated from medication  Reviewed: Allergy & Precautions, NPO status , Patient's Chart, lab work & pertinent test results  Airway Mallampati: III  TM Distance: >3 FB Neck ROM: Full    Dental no notable dental hx.    Pulmonary neg pulmonary ROS,    Pulmonary exam normal breath sounds clear to auscultation       Cardiovascular hypertension, Pt. on medications Normal cardiovascular exam Rhythm:Regular Rate:Normal  ECG: rate 76. Normal sinus rhythm Minimal voltage criteria for LVH, may be normal variant   Neuro/Psych suprafascial hematoma with small epidural collection of mixed components that narrows the thecal sac negative psych ROS   GI/Hepatic negative GI ROS, Neg liver ROS,   Endo/Other  negative endocrine ROS  Renal/GU negative Renal ROS     Musculoskeletal negative musculoskeletal ROS (+)   Abdominal   Peds  Hematology  (+) anemia ,   Anesthesia Other Findings lumbar hematoma  Reproductive/Obstetrics                             Anesthesia Physical Anesthesia Plan  ASA: II  Anesthesia Plan: General   Post-op Pain Management:    Induction: Intravenous  PONV Risk Score and Plan: 3 and Dexamethasone, Ondansetron and Treatment may vary due to age or medical condition  Airway Management Planned: Oral ETT  Additional Equipment:   Intra-op Plan:   Post-operative Plan: Extubation in OR  Informed Consent: I have reviewed the patients History and Physical, chart, labs and discussed the procedure including the risks, benefits and alternatives for the proposed anesthesia with the patient or authorized representative who has indicated his/her understanding and acceptance.   Dental advisory given  Plan Discussed with: CRNA  Anesthesia Plan Comments:         Anesthesia Quick  Evaluation

## 2018-04-06 NOTE — Progress Notes (Signed)
Neurosurgery Service Progress Note  Subjective: Drain was pulled yesterday, early this morning pt had increasing swelling and firmness in his incision with increasing back pain and new onset lower extremity parasthesias. MRI obtained which shows lumbar hematoma  Objective: Vitals:   04/05/18 1549 04/05/18 1943 04/05/18 2315 04/06/18 0400  BP: 127/76 127/70  (!) 144/85  Pulse:  94  94  Resp:  18 18 18   Temp: 98.9 F (37.2 C) 99.5 F (37.5 C) 100.3 F (37.9 C) 99.2 F (37.3 C)  TempSrc: Oral Oral Oral Oral  SpO2:  94% 93% 97%  Weight:      Height:       Temp (24hrs), Avg:99.2 F (37.3 C), Min:98.5 F (36.9 C), Max:100.3 F (37.9 C)  CBC Latest Ref Rng & Units 04/05/2018 03/28/2018 06/02/2011  WBC 4.0 - 10.5 K/uL 10.0 6.5 6.4  Hemoglobin 13.0 - 17.0 g/dL 12.1(L) 14.3 15.1  Hematocrit 39.0 - 52.0 % 37.5(L) 44.5 43.1  Platelets 150 - 400 K/uL 260 289 288   BMP Latest Ref Rng & Units 04/05/2018 03/28/2018 06/02/2011  Glucose 70 - 99 mg/dL 562(Z113(H) 308(M121(H) 578(I112(H)  BUN 6 - 20 mg/dL 17 15 18   Creatinine 0.61 - 1.24 mg/dL 6.961.21 2.951.11 2.840.94  Sodium 135 - 145 mmol/L 135 137 138  Potassium 3.5 - 5.1 mmol/L 4.3 4.0 4.2  Chloride 98 - 111 mmol/L 97(L) 102 100  CO2 22 - 32 mmol/L 27 27 29   Calcium 8.9 - 10.3 mg/dL 1.3(K8.8(L) 9.4 44.010.2    Intake/Output Summary (Last 24 hours) at 04/06/2018 0714 Last data filed at 04/06/2018 0300 Gross per 24 hour  Intake 368.49 ml  Output 100 ml  Net 268.49 ml    Current Facility-Administered Medications:  .  0.9 %  sodium chloride infusion, 250 mL, Intravenous, Continuous, Tressie StalkerJenkins, Jeffrey, MD, Last Rate: 1 mL/hr at 04/04/18 2118, 250 mL at 04/04/18 2118 .  acetaminophen (TYLENOL) tablet 650 mg, 650 mg, Oral, Q4H PRN **OR** acetaminophen (TYLENOL) suppository 650 mg, 650 mg, Rectal, Q4H PRN, Tressie StalkerJenkins, Jeffrey, MD .  amLODipine (NORVASC) tablet 5 mg, 5 mg, Oral, Daily, Tressie StalkerJenkins, Jeffrey, MD, 5 mg at 04/05/18 1013 .  benazepril (LOTENSIN) tablet 20 mg, 20 mg, Oral,  Daily, Tressie StalkerJenkins, Jeffrey, MD, 20 mg at 04/05/18 1013 .  bisacodyl (DULCOLAX) suppository 10 mg, 10 mg, Rectal, Daily PRN, Tressie StalkerJenkins, Jeffrey, MD .  cyclobenzaprine (FLEXERIL) tablet 10 mg, 10 mg, Oral, TID PRN, Tressie StalkerJenkins, Jeffrey, MD, 10 mg at 04/06/18 0522 .  docusate sodium (COLACE) capsule 100 mg, 100 mg, Oral, BID, Tressie StalkerJenkins, Jeffrey, MD, 100 mg at 04/05/18 2131 .  hydrochlorothiazide (HYDRODIURIL) tablet 25 mg, 25 mg, Oral, Daily, Tressie StalkerJenkins, Jeffrey, MD, 25 mg at 04/05/18 1013 .  lactated ringers infusion, , Intravenous, Continuous, Dorris SinghGreen, Charlene, MD, Stopped at 04/05/18 0900 .  menthol-cetylpyridinium (CEPACOL) lozenge 3 mg, 1 lozenge, Oral, PRN **OR** phenol (CHLORASEPTIC) mouth spray 1 spray, 1 spray, Mouth/Throat, PRN, Tressie StalkerJenkins, Jeffrey, MD .  morphine 4 MG/ML injection 4 mg, 4 mg, Intravenous, Q2H PRN, Tressie StalkerJenkins, Jeffrey, MD .  multivitamin with minerals tablet 1 tablet, 1 tablet, Oral, Daily, Tressie StalkerJenkins, Jeffrey, MD, 1 tablet at 04/05/18 1012 .  ondansetron (ZOFRAN) tablet 4 mg, 4 mg, Oral, Q6H PRN, 4 mg at 04/05/18 0520 **OR** ondansetron (ZOFRAN) injection 4 mg, 4 mg, Intravenous, Q6H PRN, Tressie StalkerJenkins, Jeffrey, MD .  oxyCODONE (Oxy IR/ROXICODONE) immediate release tablet 10 mg, 10 mg, Oral, Q3H PRN, Tressie StalkerJenkins, Jeffrey, MD, 10 mg at 04/06/18 0523 .  oxyCODONE (Oxy IR/ROXICODONE) immediate release  tablet 5 mg, 5 mg, Oral, Q3H PRN, Tressie Stalker, MD, 5 mg at 04/04/18 1853 .  sodium chloride flush (NS) 0.9 % injection 3 mL, 3 mL, Intravenous, Q12H, Tressie Stalker, MD, 3 mL at 04/05/18 2131 .  sodium chloride flush (NS) 0.9 % injection 3 mL, 3 mL, Intravenous, PRN, Tressie Stalker, MD .  vitamin B-12 (CYANOCOBALAMIN) tablet 1,000 mcg, 1,000 mcg, Oral, Daily, Tressie Stalker, MD, 1,000 mcg at 04/05/18 1013   Physical Exam: AOx3, PERRL, EOMI, FS, Strength 5/5 x4, SILTx4 Incision firm and exquisitely tender to touch in paraspinal musculature  Assessment & Plan: 52 y.o. man s/p redo lumbar  decompression and instrumented fusion. MRI overnight shows compressive suprafascial hematoma with small epidural collection of mixed components that narrows the thecal sac. -OR today for hematoma evacuation  Gary Luna  04/06/18 7:14 AM

## 2018-04-07 ENCOUNTER — Encounter (HOSPITAL_COMMUNITY): Payer: Self-pay | Admitting: Neurological Surgery

## 2018-04-07 MED ORDER — BISACODYL 5 MG PO TBEC
5.0000 mg | DELAYED_RELEASE_TABLET | Freq: Every day | ORAL | Status: DC | PRN
  Administered 2018-04-07 – 2018-04-08 (×2): 5 mg via ORAL
  Filled 2018-04-07 (×2): qty 1

## 2018-04-07 NOTE — Progress Notes (Signed)
Overnight, pt's drain had an output of 80 mL. Tingling and numbness from previous night is now gone. Full sensation has returned. Pt up to bathroom several times throughout night w/ steady gait and decreased pain.

## 2018-04-07 NOTE — Progress Notes (Signed)
Neurosurgery Service Progress Note  Subjective: Feels much better yesterday since seroma evacuation, back and flank pain resolved, BLE parasthesias and numbness resolved  Objective: Vitals:   04/06/18 2022 04/06/18 2335 04/07/18 0510 04/07/18 0710  BP: 133/74 135/90 115/82 (!) 144/89  Pulse: 69 72 72   Resp:      Temp: 99.5 F (37.5 C) 98.6 F (37 C) 98.6 F (37 C) 98.8 F (37.1 C)  TempSrc: Oral Oral Oral Oral  SpO2: 99% 98% 99% 98%  Weight:      Height:       Temp (24hrs), Avg:98.3 F (36.8 C), Min:97.2 F (36.2 C), Max:99.5 F (37.5 C)  CBC Latest Ref Rng & Units 04/05/2018 03/28/2018 06/02/2011  WBC 4.0 - 10.5 K/uL 10.0 6.5 6.4  Hemoglobin 13.0 - 17.0 g/dL 12.1(L) 14.3 15.1  Hematocrit 39.0 - 52.0 % 37.5(L) 44.5 43.1  Platelets 150 - 400 K/uL 260 289 288   BMP Latest Ref Rng & Units 04/05/2018 03/28/2018 06/02/2011  Glucose 70 - 99 mg/dL 098(J) 191(Y) 782(N)  BUN 6 - 20 mg/dL 17 15 18   Creatinine 0.61 - 1.24 mg/dL 5.62 1.30 8.65  Sodium 135 - 145 mmol/L 135 137 138  Potassium 3.5 - 5.1 mmol/L 4.3 4.0 4.2  Chloride 98 - 111 mmol/L 97(L) 102 100  CO2 22 - 32 mmol/L 27 27 29   Calcium 8.9 - 10.3 mg/dL 7.8(I) 9.4 69.6    Intake/Output Summary (Last 24 hours) at 04/07/2018 0957 Last data filed at 04/07/2018 0630 Gross per 24 hour  Intake 1070 ml  Output 768 ml  Net 302 ml    Current Facility-Administered Medications:  .  0.9 %  sodium chloride infusion, 250 mL, Intravenous, Continuous, Ahkeem Goede A, MD, Last Rate: 1 mL/hr at 04/04/18 2118, 250 mL at 04/04/18 2118 .  acetaminophen (TYLENOL) tablet 650 mg, 650 mg, Oral, Q4H PRN **OR** acetaminophen (TYLENOL) suppository 650 mg, 650 mg, Rectal, Q4H PRN, Jadene Pierini, MD .  amLODipine (NORVASC) tablet 5 mg, 5 mg, Oral, Daily, Meggen Spaziani, Clovis Pu, MD, 5 mg at 04/07/18 2952 .  benazepril (LOTENSIN) tablet 20 mg, 20 mg, Oral, Daily, Jadene Pierini, MD, 20 mg at 04/07/18 8413 .  bisacodyl (DULCOLAX)  suppository 10 mg, 10 mg, Rectal, Daily PRN, Jadene Pierini, MD .  cyclobenzaprine (FLEXERIL) tablet 10 mg, 10 mg, Oral, TID PRN, Jadene Pierini, MD, 10 mg at 04/06/18 1844 .  docusate sodium (COLACE) capsule 100 mg, 100 mg, Oral, BID, Jadene Pierini, MD, 100 mg at 04/07/18 2440 .  hydrochlorothiazide (HYDRODIURIL) tablet 25 mg, 25 mg, Oral, Daily, Jadene Pierini, MD, 25 mg at 04/07/18 0840 .  lactated ringers infusion, , Intravenous, Continuous, Jadene Pierini, MD, Last Rate: 10 mL/hr at 04/06/18 0938 .  menthol-cetylpyridinium (CEPACOL) lozenge 3 mg, 1 lozenge, Oral, PRN **OR** phenol (CHLORASEPTIC) mouth spray 1 spray, 1 spray, Mouth/Throat, PRN, Marti Acebo A, MD .  morphine 4 MG/ML injection 4 mg, 4 mg, Intravenous, Q2H PRN, Jadene Pierini, MD, 2 mg at 04/06/18 [phone] .  multivitamin with minerals tablet 1 tablet, 1 tablet, Oral, Daily, Jadene Pierini, MD, 1 tablet at 04/07/18 0831 .  ondansetron (ZOFRAN) tablet 4 mg, 4 mg, Oral, Q6H PRN, 4 mg at 04/05/18 0520 **OR** ondansetron (ZOFRAN) injection 4 mg, 4 mg, Intravenous, Q6H PRN, Makyle Eslick A, MD .  oxyCODONE (Oxy IR/ROXICODONE) immediate release tablet 10 mg, 10 mg, Oral, Q3H PRN, Jadene Pierini, MD, 10 mg at 04/07/18 0327 .  oxyCODONE (Oxy IR/ROXICODONE) immediate release tablet 5 mg, 5 mg, Oral, Q3H PRN, Jadene Pierinistergard, Tamekia Rotter A, MD, 5 mg at 04/06/18 0830 .  sodium chloride flush (NS) 0.9 % injection 3 mL, 3 mL, Intravenous, Q12H, Livier Hendel, Clovis Puhomas A, MD, 3 mL at 04/07/18 [id number] .  sodium chloride flush (NS) 0.9 % injection 3 mL, 3 mL, Intravenous, PRN, Demetrios Byron, Clovis Puhomas A, MD .  vitamin B-12 (CYANOCOBALAMIN) tablet 1,000 mcg, 1,000 mcg, Oral, Daily, [place] Graffam, Clovis Puhomas A, MD, 1,000 mcg at 04/07/18 0831   Physical Exam: AOx3, PERRL, EOMI, FS, Strength 5/5 x4, SILTx4 Incision soft Drain in place w/ serosang output  Assessment & Plan: 52 y.o. man s/p redo lumbar decompression and instrumented  fusion. MRI overnight shows compressive suprafascial hematoma with small epidural collection of mixed components that narrows the thecal sac. 12/15 s/p operative wound revision and evacuation of compressive seroma -continue drain, 193cc in past 24h -PT/OT recs -SCDs/TEDs  Jadene Pierinihomas A Tahliyah Anagnos  04/07/18 9:57 AM

## 2018-04-07 NOTE — Progress Notes (Signed)
Physical Therapy Treatment Patient Details Name: Gary Luna MRN: [id number] DOB: 03-06-1966 Today's Date: 04/07/2018    History of Present Illness 52 yo admitted for L3-S1 PLIF. PMHx: back sx, arthritis, DM, HTN. OF NOTE: patient back to OR on 12/14 for hematoma evacuation    PT Comments    Patient seen for activity progression. Patient from OR yesterday for hematoma evacuation, improvement in pain and LEs deficits noted. Patient mobilizing well with RW. Will continue to progress to no device. Current POC remains appropriate.  Follow Up Recommendations  No PT follow up     Equipment Recommendations  None recommended by PT    Recommendations for Other Services       Precautions / Restrictions Precautions Precautions: Back Precaution Booklet Issued: Yes (comment) Precaution Comments: pt able to recall all back precautions Required Braces or Orthoses: Spinal Brace Spinal Brace: Lumbar corset;Applied in sitting position    Mobility  Bed Mobility Overal bed mobility: Needs Assistance Bed Mobility: Rolling;Sidelying to Sit Rolling: Supervision Sidelying to sit: Supervision       General bed mobility comments: cues for sequence  Transfers Overall transfer level: Needs assistance   Transfers: Sit to/from Stand Sit to Stand: Supervision         General transfer comment: cues for posture  Ambulation/Gait Ambulation/Gait assistance: Supervision Gait Distance (Feet): 600 Feet Assistive device: Rolling walker (2 wheeled) Gait Pattern/deviations: Step-through pattern;Decreased stride length     General Gait Details: Good stability, cues for increased cadence   Stairs             Wheelchair Mobility    Modified Rankin (Stroke Patients Only)       Balance Overall balance assessment: No apparent balance deficits (not formally assessed)                                          Cognition Arousal/Alertness: Awake/alert Behavior  During Therapy: WFL for tasks assessed/performed Overall Cognitive Status: Within Functional Limits for tasks assessed                                        Exercises      General Comments        Pertinent Vitals/Pain Pain Assessment: 0-10 Pain Score: 3  Pain Location: incision Pain Descriptors / Indicators: Aching Pain Intervention(s): Monitored during session    Home Living                      Prior Function            PT Goals (current goals can now be found in the care plan section) Acute Rehab PT Goals Patient Stated Goal: return to work, bike at [employer] park PT Goal Formulation: With patient Time For Goal Achievement: 04/12/18 Potential to Achieve Goals: Good Progress towards PT goals: Progressing toward goals    Frequency    Min 5X/week      PT Plan Current plan remains appropriate    Co-evaluation              AM-PAC PT "6 Clicks" Mobility   Outcome Measure  Help needed turning from your back to your side while in a flat bed without using bedrails?: None Help needed moving from lying on your back to sitting on the  side of a flat bed without using bedrails?: None Help needed moving to and from a bed to a chair (including a wheelchair)?: None Help needed standing up from a chair using your arms (e.g., wheelchair or bedside chair)?: None Help needed to walk in hospital room?: A Little Help needed climbing 3-5 steps with a railing? : A Little 6 Click Score: 22    End of Session Equipment Utilized During Treatment: Back brace Activity Tolerance: Patient tolerated treatment well Patient left: in chair;with call bell/phone within reach;with family/visitor present;with nursing/sitter in room Nurse Communication: Mobility status;Precautions PT Visit Diagnosis: Other abnormalities of gait and mobility (R26.89)     Time: 4098-11910912-0932 PT Time Calculation (min) (ACUTE ONLY): 20 min  Charges:  $Gait Training: 8-22 mins                      Charlotte Crumbevon Eryn Krejci, PT DPT  Board Certified Neurologic Specialist Acute Rehabilitation Services Pager [phone] Office [phone]    Fabio AsaDevon J Dajai Wahlert 04/07/2018, 9:55 AM

## 2018-04-08 MED ORDER — BISACODYL 10 MG RE SUPP
10.0000 mg | Freq: Every day | RECTAL | Status: DC | PRN
  Administered 2018-04-08: 10 mg via RECTAL
  Filled 2018-04-08: qty 1

## 2018-04-08 NOTE — Progress Notes (Signed)
Overnight, pt's drain had an output of 50 mL. Scant drainage at base of honeycomb near JP drain. Lumbar area is swollen (same at beginning and end of shift) but no complaints of tingling or numbness. States that he has some pain radiating down RT leg that resolves with repositioning.

## 2018-04-08 NOTE — Progress Notes (Signed)
Subjective: The patient is alert and pleasant.  He is in no apparent distress.  His wife is at the bedside.  He denies headaches.  Objective: Vital signs in last 24 hours: Temp:  [98.2 F (36.8 C)-99 F (37.2 C)] 98.2 F (36.8 C) (12/16 0700) Pulse Rate:  [84-93] 84 (12/16 0700) BP: (113-169)/(63-91) 113/63 (12/16 0700) SpO2:  [95 %-100 %] 98 % (12/16 0700) Estimated body mass index is 25.83 kg/m as calculated from the following:   Height as of this encounter: 5\' 10"  (1.778 m).   Weight as of this encounter: 81.6 kg.   Intake/Output from previous day: 12/15 0701 - 12/16 0700 In: 394.4 [P.O.:360; I.V.:34.4] Out: 120 [Drains:120] Intake/Output this shift: No intake/output data recorded.  Physical exam patient is alert and pleasant.  He is moving his lower extremities well.  His wound is intact.  It is a bit edematous.  His drain has put out 120 cc of serosanguineous fluid overnight.  Lab Results: No results for input(s): WBC, HGB, HCT, PLT in the last 72 hours. BMET No results for input(s): NA, K, CL, CO2, GLUCOSE, BUN, CREATININE, CALCIUM in the last 72 hours.  Studies/Results: No results found.  Assessment/Plan: Postop day #4/2: The patient is doing much better clinically.  His drain continues to put out serosanguineous fluid.  He is not having any symptoms to suggest spinal fluid leak.  I will continue the drain for today and likely remove it tomorrow.  We will tentatively plan to send him home on Wednesday.  I have answered all their questions.  LOS: 4 days     Cristi LoronJeffrey D Derotha Fishbaugh 04/08/2018, 9:52 AM

## 2018-04-08 NOTE — Progress Notes (Signed)
Physical Therapy Treatment Patient Details Name: Gary Luna MRN: [id number] DOB: Jun 01, 1965 Today's Date: 04/08/2018    History of Present Illness 52 yo admitted for L3-S1 PLIF. PMHx: back sx, arthritis, DM, HTN. OF NOTE: patient back to OR on 12/14 for hematoma evacuation    PT Comments    Pt progressing well towards all goals and functioning at mod I level. Pt safe to d/c home from mobility stand point. Pt with minimal pain at 3/10. Pt no longer needs RW for safe ambulation and is able to safely navigate stairs with L HR, mimic home set up. Acute PT to cont to follow.    Follow Up Recommendations  No PT follow up     Equipment Recommendations  None recommended by PT    Recommendations for Other Services       Precautions / Restrictions Precautions Precautions: Back Precaution Booklet Issued: Yes (comment) Precaution Comments: pt recalled 2/3, re-educated on BLT, pt with verbal understanding Required Braces or Orthoses: Spinal Brace Spinal Brace: Lumbar corset;Applied in sitting position(pt indep with donning) Restrictions Weight Bearing Restrictions: No    Mobility  Bed Mobility Overal bed mobility: Modified Independent Bed Mobility: Sidelying to Sit   Sidelying to sit: Modified independent (Device/Increase time)       General bed mobility comments: HOB slightly elevated, has an adjustable bed at home, no physical assist needed  Transfers Overall transfer level: Modified independent   Transfers: Sit to/from Stand Sit to Stand: Modified independent (Device/Increase time)         General transfer comment: no physical assist, good technique  Ambulation/Gait Ambulation/Gait assistance: Supervision Gait Distance (Feet): 600 Feet Assistive device: None Gait Pattern/deviations: Decreased dorsiflexion - right;Step-through pattern Gait velocity: dec Gait velocity interpretation: >2.62 ft/sec, indicative of community ambulatory General Gait Details: mildly  guarded and cautious, educated on isometric contraction of abdominal muscles to take pressure of back, no episodes of LOB or increased pain   Stairs Stairs: Yes Stairs assistance: Modified independent (Device/Increase time) Stair Management: Alternating pattern;Forwards;One rail Left Number of Stairs: 25 General stair comments: safe and demonstrates good stability with flight of stairs   Wheelchair Mobility    Modified Rankin (Stroke Patients Only)       Balance Overall balance assessment: No apparent balance deficits (not formally assessed)                                          Cognition Arousal/Alertness: Awake/alert Behavior During Therapy: WFL for tasks assessed/performed Overall Cognitive Status: Within Functional Limits for tasks assessed                                        Exercises      General Comments General comments (skin integrity, edema, etc.): pt cont to have JP drain      Pertinent Vitals/Pain Pain Assessment: 0-10 Pain Score: 3  Pain Location: incision, throbbing in R LE Pain Descriptors / Indicators: Aching;Throbbing Pain Intervention(s): Monitored during session    Home Living                      Prior Function            PT Goals (current goals can now be found in the care plan section) Acute Rehab PT Goals Patient  Stated Goal: go home today Progress towards PT goals: Progressing toward goals    Frequency    Min 5X/week      PT Plan Current plan remains appropriate    Co-evaluation              AM-PAC PT "6 Clicks" Mobility   Outcome Measure  Help needed turning from your back to your side while in a flat bed without using bedrails?: None Help needed moving from lying on your back to sitting on the side of a flat bed without using bedrails?: None Help needed moving to and from a bed to a chair (including a wheelchair)?: None Help needed standing up from a chair using  your arms (e.g., wheelchair or bedside chair)?: None Help needed to walk in hospital room?: None Help needed climbing 3-5 steps with a railing? : A Little 6 Click Score: 23    End of Session Equipment Utilized During Treatment: Back brace Activity Tolerance: Patient tolerated treatment well Patient left: in bed;with call bell/phone within reach;with family/visitor present(sitting EOB) Nurse Communication: Mobility status;Precautions PT Visit Diagnosis: Other abnormalities of gait and mobility (R26.89)     Time: 4098-1191 PT Time Calculation (min) (ACUTE ONLY): 25 min  Charges:  $Gait Training: 23-37 mins                     Lewis Shock, PT, DPT Acute Rehabilitation Services Pager #: [phone] Office #: [phone]    Iona Hansen 04/08/2018, 7:33 AM

## 2018-04-09 ENCOUNTER — Encounter (HOSPITAL_COMMUNITY): Payer: Self-pay | Admitting: Neurosurgery

## 2018-04-09 MED ORDER — CEPHALEXIN 500 MG PO CAPS
1000.0000 mg | ORAL_CAPSULE | Freq: Four times a day (QID) | ORAL | Status: DC
  Administered 2018-04-09: 1000 mg via ORAL
  Filled 2018-04-09: qty 2

## 2018-04-09 MED ORDER — CYCLOBENZAPRINE HCL 10 MG PO TABS: 10.0000 mg | ORAL_TABLET | Freq: Three times a day (TID) | ORAL | 1 refills | Status: DC | PRN

## 2018-04-09 MED ORDER — DOCUSATE SODIUM 100 MG PO CAPS: 100.0000 mg | ORAL_CAPSULE | Freq: Two times a day (BID) | ORAL | 0 refills | Status: DC

## 2018-04-09 MED ORDER — CEPHALEXIN 500 MG PO CAPS: 1000.0000 mg | ORAL_CAPSULE | Freq: Four times a day (QID) | ORAL | 0 refills | Status: DC

## 2018-04-09 MED ORDER — OXYCODONE HCL 5 MG PO TABS: 5.0000 mg | ORAL_TABLET | ORAL | 0 refills | Status: DC | PRN

## 2018-04-09 NOTE — Care Management Note (Signed)
Case Management Note  Patient Details  Name: Sharlotte Alamoerence Spong MRN: [id number] Date of Birth: 10/13/1965  Subjective/Objective:  52 yo admitted for L3-S1 PLIF.  PTA, pt independent, lives alone.                   Action/Plan: PT/OT recommending no OP follow up.  Pt requests RW and 3 in 1 for home.  Family and friends to assist as needed at discharge.   Expected Discharge Date:                  Expected Discharge Plan:  Home/Self Care  In-House Referral:     Discharge planning Services  CM Consult  Post Acute Care Choice:    Choice offered to:     DME Arranged:  3-N-1, Walker rolling DME Agency:  [facility]  HH Arranged:    HH Agency:     Status of Service:  Completed, signed off  If discussed at Long Length of Stay Meetings, dates discussed:    Additional Comments:  Quintella BatonJulie W. Nikky Duba, RN, BSN  Trauma/Neuro ICU Case Manager [phone]

## 2018-04-09 NOTE — Progress Notes (Signed)
Patient given d/c instructions, questions answered. No printed prescriptions to give.  Equipment delivered.  IV removed. Patient taken to car via wheelchair with family and all belongings.

## 2018-04-09 NOTE — Discharge Summary (Signed)
Physician Discharge Summary  Patient ID: Gary Luna MRN: [id number] DOB/AGE: 1965-05-19 52 y.o.  Admit date: 04/04/2018 Discharge date: 04/09/2018  Admission Diagnoses: lumbar spondylolisthesis, lumbar degenerative disease, lumbago, lumbar radiculopathy  Discharge Diagnoses:  The same and wound seroma Active Problems:   Spondylolisthesis of lumbar region   Discharged Condition: good  Hospital Course:  I performed an L3-4, L4-5 and L5-S1 redo decompression, instrumentation and fusion on the patient on 04/04/2018.  The surgery went well.    The patient's postoperative course was remarkable for a wound seroma which was evacuated by Dr. Maurice Small on 04/06/2018.  The remainder of the patient's postoperative course was unremarkable.   He was started on Keflex empirically because he had a prolonged drain placed. On 04/09/2018 the patient looked and felt much better.  He requested discharge to home.  He was given written and oral discharge instructions.  All his questions were answered.  Consults:  Physical therapy Significant Diagnostic Studies: lumbar MRI Treatments: L3-4, L4-5 and L5-S1 decompression, instrumentation, and fusion; evacuation of wound seroma Discharge Exam: Blood pressure 129/88, pulse (!) 135, temperature 98.8 F (37.1 C), temperature source Oral, resp. rate 18, height 5\' 10"  (1.778 m), weight 81.6 kg, SpO2 96 %.  the patient is alert and pleasant.  His strength is normal.  He looks well.  His wound is healing well.  Disposition:   Home  Discharge Instructions    Call MD for:  difficulty breathing, headache or visual disturbances   Complete by:  As directed    Call MD for:  extreme fatigue   Complete by:  As directed    Call MD for:  hives   Complete by:  As directed    Call MD for:  persistant dizziness or light-headedness   Complete by:  As directed    Call MD for:  persistant nausea and vomiting   Complete by:  As directed    Call MD for:  redness,  tenderness, or signs of infection (pain, swelling, redness, odor or green/yellow discharge around incision site)   Complete by:  As directed    Call MD for:  severe uncontrolled pain   Complete by:  As directed    Call MD for:  temperature >100.4   Complete by:  As directed    Diet - low sodium heart healthy   Complete by:  As directed    Discharge instructions   Complete by:  As directed    Call [phone] for a followup appointment. Take a stool softener while you are using pain medications.   Driving Restrictions   Complete by:  As directed    Do not drive for 2 weeks.   Increase activity slowly   Complete by:  As directed    Lifting restrictions   Complete by:  As directed    Do not lift more than 5 pounds. No excessive bending or twisting.   May shower / Bathe   Complete by:  As directed    Remove the dressing for 3 days after surgery.  You may shower, but leave the incision alone.   Remove dressing in 24 hours   Complete by:  As directed      Allergies as of 04/09/2018   No Known Allergies     Medication List    TAKE these medications   amLODipine 5 MG tablet Commonly known as:  NORVASC Take 5 mg by mouth daily.   benazepril 5 MG tablet Commonly known as:  LOTENSIN Take 5 mg  by mouth daily.   benazepril-hydrochlorthiazide 20-25 MG tablet Commonly known as:  LOTENSIN HCT Take 1 tablet by mouth daily.   cephALEXin 500 MG capsule Commonly known as:  KEFLEX Take 2 capsules (1,000 mg total) by mouth every 6 (six) hours.   cyclobenzaprine 10 MG tablet Commonly known as:  FLEXERIL Take 1 tablet (10 mg total) by mouth 3 (three) times daily as needed for muscle spasms.   docusate sodium 100 MG capsule Commonly known as:  COLACE Take 1 capsule (100 mg total) by mouth 2 (two) times daily.   multivitamins ther. w/minerals Tabs tablet Take 1 tablet by mouth daily.   omega-3 acid ethyl esters 1 g capsule Commonly known as:  LOVAZA Take 1 capsule by mouth  daily.   oxyCODONE 5 MG immediate release tablet Commonly known as:  Oxy IR/ROXICODONE Take 1 tablet (5 mg total) by mouth every 4 (four) hours as needed for moderate pain ((score 4 to 6)).   vitamin B-12 1000 MCG tablet Commonly known as:  CYANOCOBALAMIN Take 1,000 mcg by mouth daily.            Durable Medical Equipment  (From admission, onward)         Start     Ordered   04/09/18 1234  For home use only DME Bedside commode  Once    Question:  Patient needs a bedside commode to treat with the following condition  Answer:  Spondylolisthesis   04/09/18 1233   04/09/18 1233  For home use only DME Walker rolling  Once    Question:  Patient needs a walker to treat with the following condition  Answer:  Spondylolisthesis   04/09/18 1233           Signed: Cristi LoronJeffrey D Lucia Mccreadie 04/09/2018, 2:48 PM

## 2018-04-09 NOTE — Progress Notes (Signed)
Physical Therapy Treatment Patient Details Name: Gary Luna MRN: [id number] DOB: April 16, 1966 Today's Date: 04/09/2018    History of Present Illness 52 yo admitted for L3-S1 PLIF. PMHx: back sx, arthritis, DM, HTN. OF NOTE: patient back to OR on 12/14 for hematoma evacuation    PT Comments    Pt very pleasant sitting eOB on arrival. Pt recounting that when hematoma developed he had excruciating pain and is now very cautious because he is afraid of recurrence. Pt able to state and maintain precautions and benefits from cues for increased trunk mobility to decrease rigidity and fluid movement. Pt safe for D/C home.     Follow Up Recommendations  No PT follow up     Equipment Recommendations  None recommended by PT    Recommendations for Other Services       Precautions / Restrictions Precautions Precautions: Back Precaution Comments: pt able to state all and demonstrate understanding Required Braces or Orthoses: Spinal Brace Spinal Brace: Lumbar corset;Applied in sitting position Restrictions Weight Bearing Restrictions: No    Mobility  Bed Mobility   Bed Mobility: Sit to Sidelying         Sit to sidelying: Modified independent (Device/Increase time) General bed mobility comments: EOB on arrival, able to transition to sidelying without assist  Transfers Overall transfer level: Independent   Transfers: Sit to/from Stand Sit to Stand: Independent            Ambulation/Gait Ambulation/Gait assistance: Modified independent (Device/Increase time) Gait Distance (Feet): 500 Feet Assistive device: None Gait Pattern/deviations: Step-through pattern;Decreased stride length   Gait velocity interpretation: >2.62 ft/sec, indicative of community ambulatory General Gait Details: pt with decreased arm swing and trunk rotation very rigid and cautious with mobility. Cues for increased stride and arm swing   Stairs             Wheelchair Mobility    Modified  Rankin (Stroke Patients Only)       Balance Overall balance assessment: No apparent balance deficits (not formally assessed)                                          Cognition Arousal/Alertness: Awake/alert Behavior During Therapy: WFL for tasks assessed/performed Overall Cognitive Status: Within Functional Limits for tasks assessed                                        Exercises      General Comments        Pertinent Vitals/Pain Pain Score: 4  Pain Descriptors / Indicators: Aching;Sore Pain Intervention(s): Limited activity within patient's tolerance    Home Living                      Prior Function            PT Goals (current goals can now be found in the care plan section) Progress towards PT goals: Progressing toward goals    Frequency           PT Plan Current plan remains appropriate    Co-evaluation              AM-PAC PT "6 Clicks" Mobility   Outcome Measure  Help needed turning from your back to your side while in a flat bed without using  bedrails?: None Help needed moving from lying on your back to sitting on the side of a flat bed without using bedrails?: None Help needed moving to and from a bed to a chair (including a wheelchair)?: None Help needed standing up from a chair using your arms (e.g., wheelchair or bedside chair)?: None Help needed to walk in hospital room?: None Help needed climbing 3-5 steps with a railing? : None 6 Click Score: 24    End of Session Equipment Utilized During Treatment: Back brace Activity Tolerance: Patient tolerated treatment well Patient left: in bed;with call bell/phone within reach(EOB for drain pull) Nurse Communication: Mobility status PT Visit Diagnosis: Other abnormalities of gait and mobility (R26.89)     Time: 7829-56210735-0751 PT Time Calculation (min) (ACUTE ONLY): 16 min  Charges:  $Gait Training: 8-22 mins                     Dreana Britz Abner Greenspanabor  Kamaury Cutbirth, PT Acute Rehabilitation Services Pager: [phone] Office: [phone]    Jeremie Abdelaziz B Steven Veazie 04/09/2018, 9:45 AM

## 2018-04-09 NOTE — Progress Notes (Signed)
Subjective: The patient is alert and pleasant.  He wants to go home.  He looks well.  Objective: Vital signs in last 24 hours: Temp:  [98.2 F (36.8 C)-98.9 F (37.2 C)] 98.8 F (37.1 C) (12/17 0725) Pulse Rate:  [78-135] 135 (12/17 0725) Resp:  [18-20] 18 (12/17 0300) BP: (114-133)/(70-91) 129/88 (12/17 0725) SpO2:  [96 %-100 %] 96 % (12/17 0725) Estimated body mass index is 25.83 kg/m as calculated from the following:   Height as of this encounter: 5\' 10"  (1.778 m).   Weight as of this encounter: 81.6 kg.   Intake/Output from previous day: 12/16 0701 - 12/17 0700 In: 960 [P.O.:960] Out: 580 [Urine:500; Drains:80] Intake/Output this shift: No intake/output data recorded.  Physical exam patient is alert and oriented.  His dressing is clean and dry.  I removed the drain.  It was occluded.  Lab Results: No results for input(s): WBC, HGB, HCT, PLT in the last 72 hours. BMET No results for input(s): NA, K, CL, CO2, GLUCOSE, BUN, CREATININE, CALCIUM in the last 72 hours.  Studies/Results: No results found.  Assessment/Plan: Postop day #5: The patient is doing well.  We will observe him this morning and likely send him home this afternoon.  I gave him his discharge instructions and answered all his questions.  LOS: 5 days     Gary Luna 04/09/2018, 8:15 AM

## 2018-05-14 DIAGNOSIS — Z981 Arthrodesis status: Secondary | ICD-10-CM | POA: Insufficient documentation

## 2019-01-28 ENCOUNTER — Other Ambulatory Visit: Payer: Self-pay

## 2019-01-28 DIAGNOSIS — Z20822 Contact with and (suspected) exposure to covid-19: Secondary | ICD-10-CM

## 2019-01-31 LAB — NOVEL CORONAVIRUS, NAA: SARS-CoV-2, NAA: NOT DETECTED

## 2019-03-13 DIAGNOSIS — S32009K Unspecified fracture of unspecified lumbar vertebra, subsequent encounter for fracture with nonunion: Secondary | ICD-10-CM | POA: Insufficient documentation

## 2019-04-30 DIAGNOSIS — Z20828 Contact with and (suspected) exposure to other viral communicable diseases: Secondary | ICD-10-CM | POA: Diagnosis not present

## 2019-08-08 DIAGNOSIS — E1169 Type 2 diabetes mellitus with other specified complication: Secondary | ICD-10-CM | POA: Diagnosis not present

## 2019-08-08 DIAGNOSIS — Z0000 Encounter for general adult medical examination without abnormal findings: Secondary | ICD-10-CM | POA: Diagnosis not present

## 2019-08-11 DIAGNOSIS — Z125 Encounter for screening for malignant neoplasm of prostate: Secondary | ICD-10-CM | POA: Diagnosis not present

## 2019-08-11 DIAGNOSIS — E782 Mixed hyperlipidemia: Secondary | ICD-10-CM | POA: Diagnosis not present

## 2019-08-11 DIAGNOSIS — Z0000 Encounter for general adult medical examination without abnormal findings: Secondary | ICD-10-CM | POA: Diagnosis not present

## 2019-08-11 DIAGNOSIS — E1169 Type 2 diabetes mellitus with other specified complication: Secondary | ICD-10-CM | POA: Diagnosis not present

## 2019-09-25 DIAGNOSIS — Z6834 Body mass index (BMI) 34.0-34.9, adult: Secondary | ICD-10-CM | POA: Diagnosis not present

## 2019-09-25 DIAGNOSIS — I10 Essential (primary) hypertension: Secondary | ICD-10-CM | POA: Diagnosis not present

## 2019-09-25 DIAGNOSIS — S32009K Unspecified fracture of unspecified lumbar vertebra, subsequent encounter for fracture with nonunion: Secondary | ICD-10-CM | POA: Diagnosis not present

## 2019-10-22 IMAGING — CR DG LUMBAR SPINE 1V
1 series · 1 of 1 positions shown · non-contrast
Comparison: 01/29/2018 and earlier, including MRI lumbar spine
04/25/2017.

CLINICAL DATA: L3-4, L4-5 and L5-S1 PLIF. Intraoperative
localization.

EXAM:
LUMBAR SPINE - 1 VIEW

[xtable lateral]
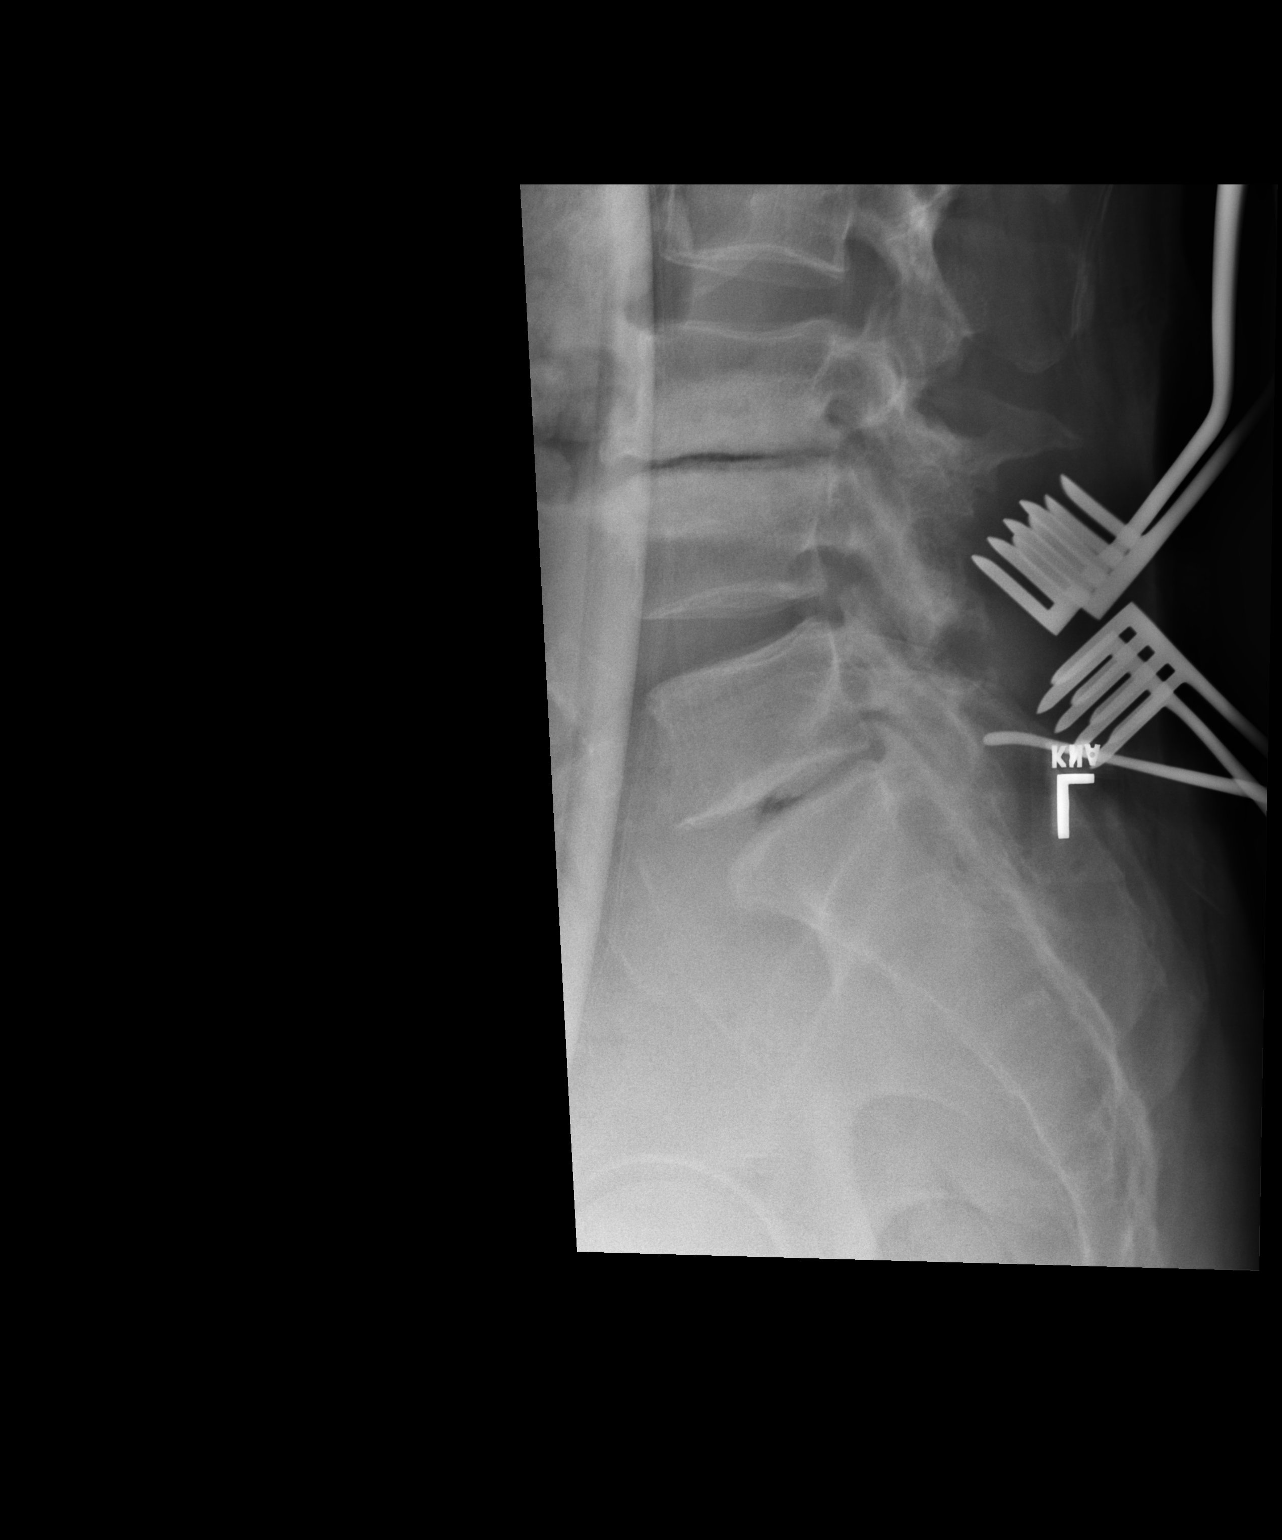

[1 of 1 positions shown; findings below may reference images not displayed]

FINDINGS: Previous examinations demonstrated 5 non-rib-bearing lumbar
vertebrae. Localization device POSTERIOR to the L5-S1 disc space.
IMPRESSION: L5-S1 localized intraoperatively.

## 2019-10-22 IMAGING — CR DG LUMBAR SPINE 1V
1 series · 1 of 1 positions shown · non-contrast
Comparison: Lumbar spine film from earlier today

CLINICAL DATA: Lumbar localization for L3-S1 fusion

EXAM:
LUMBAR SPINE - 1 VIEW

[lateral]
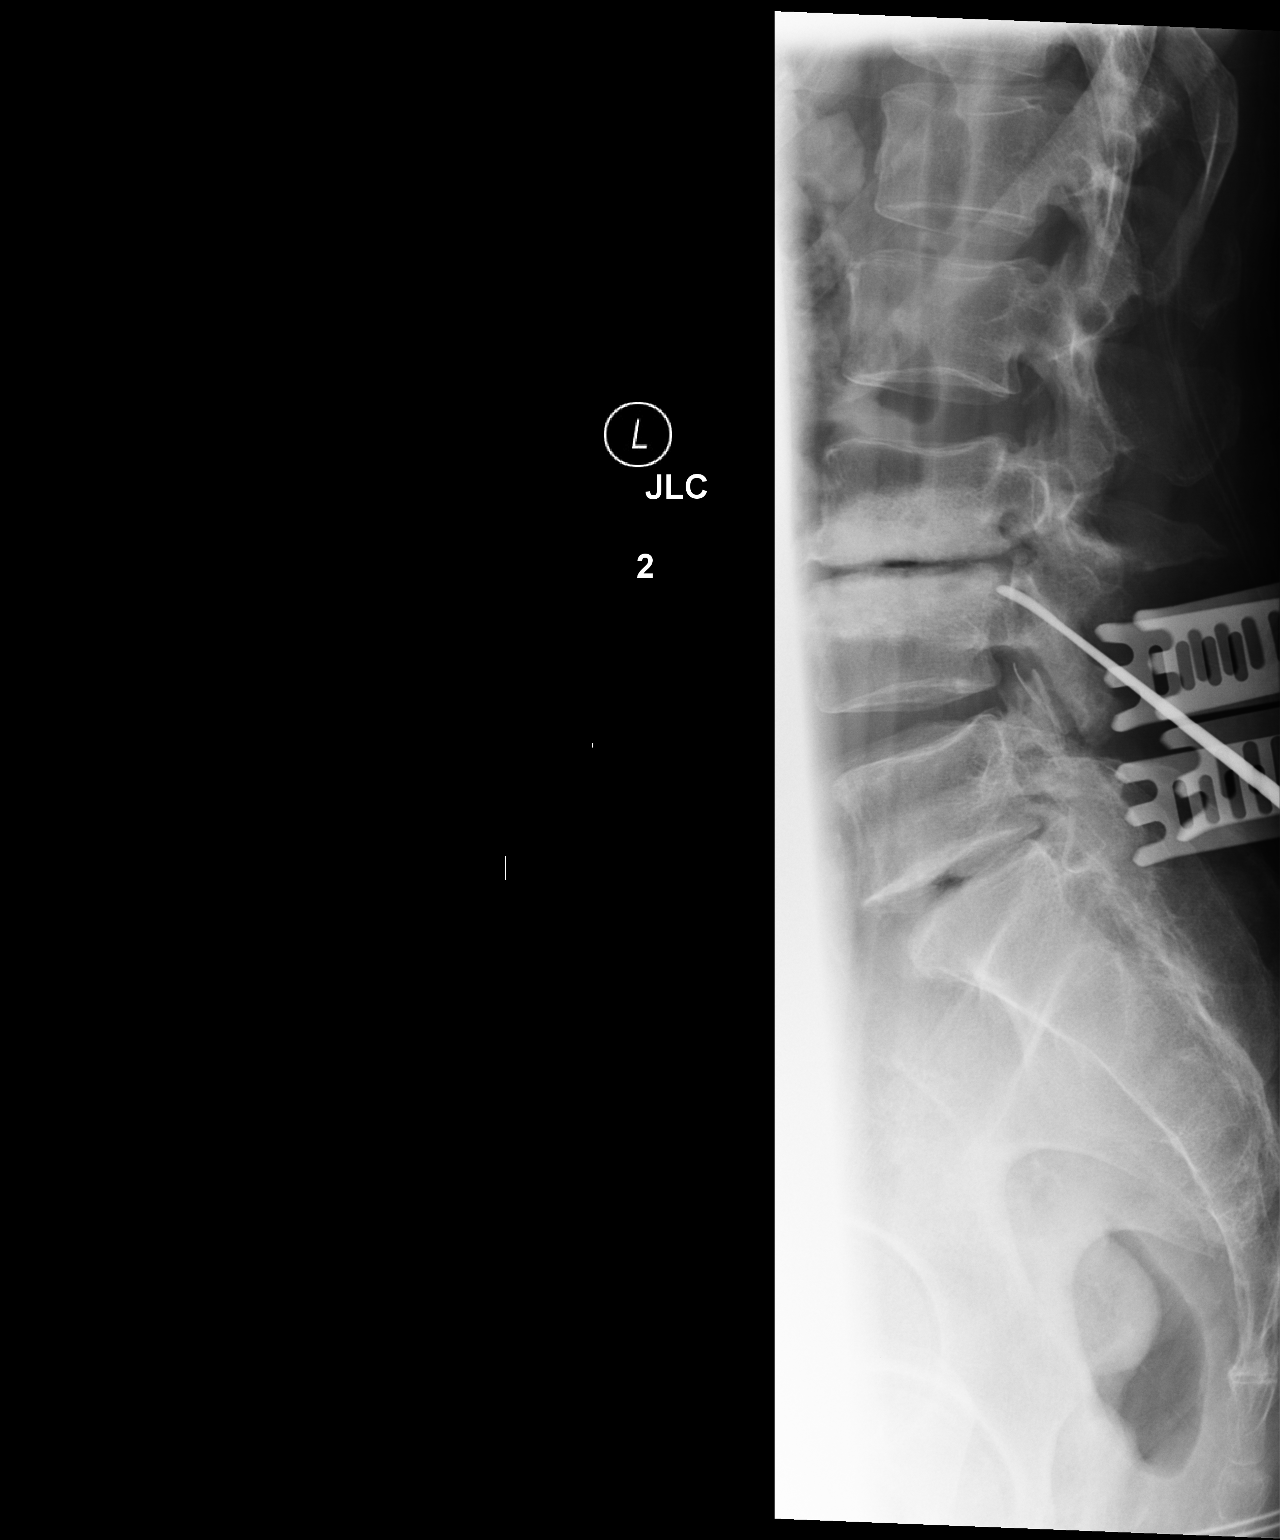

[1 of 1 positions shown; findings below may reference images not displayed]

FINDINGS: Film #2 from the operating room shows anl instrument directed toward
the L3-4 interspace posteriorly for localization.
IMPRESSION: Instrument directed toward L3-4 posteriorly.

## 2019-10-24 DIAGNOSIS — M25562 Pain in left knee: Secondary | ICD-10-CM | POA: Diagnosis not present

## 2019-10-24 DIAGNOSIS — I10 Essential (primary) hypertension: Secondary | ICD-10-CM | POA: Diagnosis not present

## 2019-11-07 DIAGNOSIS — M25569 Pain in unspecified knee: Secondary | ICD-10-CM | POA: Diagnosis not present

## 2020-08-10 DIAGNOSIS — J0100 Acute maxillary sinusitis, unspecified: Secondary | ICD-10-CM | POA: Diagnosis not present

## 2020-08-10 DIAGNOSIS — R0981 Nasal congestion: Secondary | ICD-10-CM | POA: Diagnosis not present

## 2020-08-11 DIAGNOSIS — Z20822 Contact with and (suspected) exposure to covid-19: Secondary | ICD-10-CM | POA: Diagnosis not present

## 2020-08-11 DIAGNOSIS — J0100 Acute maxillary sinusitis, unspecified: Secondary | ICD-10-CM | POA: Diagnosis not present

## 2020-08-11 DIAGNOSIS — R0981 Nasal congestion: Secondary | ICD-10-CM | POA: Diagnosis not present

## 2020-08-12 DIAGNOSIS — J0100 Acute maxillary sinusitis, unspecified: Secondary | ICD-10-CM | POA: Diagnosis not present

## 2020-09-09 DIAGNOSIS — L918 Other hypertrophic disorders of the skin: Secondary | ICD-10-CM | POA: Diagnosis not present

## 2020-09-09 DIAGNOSIS — D489 Neoplasm of uncertain behavior, unspecified: Secondary | ICD-10-CM | POA: Diagnosis not present

## 2021-01-13 DIAGNOSIS — Z125 Encounter for screening for malignant neoplasm of prostate: Secondary | ICD-10-CM | POA: Diagnosis not present

## 2021-01-13 DIAGNOSIS — Z0000 Encounter for general adult medical examination without abnormal findings: Secondary | ICD-10-CM | POA: Diagnosis not present

## 2021-01-13 DIAGNOSIS — E1169 Type 2 diabetes mellitus with other specified complication: Secondary | ICD-10-CM | POA: Diagnosis not present

## 2021-01-13 DIAGNOSIS — E782 Mixed hyperlipidemia: Secondary | ICD-10-CM | POA: Diagnosis not present

## 2021-01-13 DIAGNOSIS — I10 Essential (primary) hypertension: Secondary | ICD-10-CM | POA: Diagnosis not present

## 2021-01-13 DIAGNOSIS — T466X5A Adverse effect of antihyperlipidemic and antiarteriosclerotic drugs, initial encounter: Secondary | ICD-10-CM | POA: Diagnosis not present

## 2021-01-13 DIAGNOSIS — N529 Male erectile dysfunction, unspecified: Secondary | ICD-10-CM | POA: Diagnosis not present

## 2021-01-13 DIAGNOSIS — Z23 Encounter for immunization: Secondary | ICD-10-CM | POA: Diagnosis not present

## 2021-02-08 DIAGNOSIS — I10 Essential (primary) hypertension: Secondary | ICD-10-CM | POA: Diagnosis not present

## 2021-02-08 DIAGNOSIS — R9082 White matter disease, unspecified: Secondary | ICD-10-CM | POA: Diagnosis not present

## 2021-02-08 DIAGNOSIS — R29818 Other symptoms and signs involving the nervous system: Secondary | ICD-10-CM | POA: Diagnosis not present

## 2021-02-08 DIAGNOSIS — R200 Anesthesia of skin: Secondary | ICD-10-CM | POA: Diagnosis not present

## 2021-03-07 DIAGNOSIS — Z03818 Encounter for observation for suspected exposure to other biological agents ruled out: Secondary | ICD-10-CM | POA: Diagnosis not present

## 2021-03-07 DIAGNOSIS — J0100 Acute maxillary sinusitis, unspecified: Secondary | ICD-10-CM | POA: Diagnosis not present

## 2021-03-07 DIAGNOSIS — R0602 Shortness of breath: Secondary | ICD-10-CM | POA: Diagnosis not present

## 2021-03-07 DIAGNOSIS — J069 Acute upper respiratory infection, unspecified: Secondary | ICD-10-CM | POA: Diagnosis not present

## 2021-03-07 DIAGNOSIS — R059 Cough, unspecified: Secondary | ICD-10-CM | POA: Diagnosis not present

## 2021-06-06 ENCOUNTER — Ambulatory Visit: Payer: 59 | Admitting: Family Medicine

## 2021-07-11 DIAGNOSIS — M5412 Radiculopathy, cervical region: Secondary | ICD-10-CM | POA: Diagnosis not present

## 2021-07-11 DIAGNOSIS — J069 Acute upper respiratory infection, unspecified: Secondary | ICD-10-CM | POA: Diagnosis not present

## 2021-07-11 DIAGNOSIS — M25562 Pain in left knee: Secondary | ICD-10-CM | POA: Diagnosis not present

## 2021-07-20 DIAGNOSIS — G5601 Carpal tunnel syndrome, right upper limb: Secondary | ICD-10-CM | POA: Diagnosis not present

## 2021-07-20 DIAGNOSIS — E782 Mixed hyperlipidemia: Secondary | ICD-10-CM | POA: Diagnosis not present

## 2021-07-20 DIAGNOSIS — I10 Essential (primary) hypertension: Secondary | ICD-10-CM | POA: Diagnosis not present

## 2021-07-20 DIAGNOSIS — E1169 Type 2 diabetes mellitus with other specified complication: Secondary | ICD-10-CM | POA: Diagnosis not present

## 2021-07-20 DIAGNOSIS — M25511 Pain in right shoulder: Secondary | ICD-10-CM | POA: Diagnosis not present

## 2021-07-25 DIAGNOSIS — Z7984 Long term (current) use of oral hypoglycemic drugs: Secondary | ICD-10-CM | POA: Diagnosis not present

## 2021-07-25 DIAGNOSIS — H52203 Unspecified astigmatism, bilateral: Secondary | ICD-10-CM | POA: Diagnosis not present

## 2021-07-25 DIAGNOSIS — E119 Type 2 diabetes mellitus without complications: Secondary | ICD-10-CM | POA: Diagnosis not present

## 2021-07-25 DIAGNOSIS — H524 Presbyopia: Secondary | ICD-10-CM | POA: Diagnosis not present

## 2021-07-25 DIAGNOSIS — H5213 Myopia, bilateral: Secondary | ICD-10-CM | POA: Diagnosis not present

## 2021-07-26 DIAGNOSIS — M79601 Pain in right arm: Secondary | ICD-10-CM | POA: Diagnosis not present

## 2021-08-09 DIAGNOSIS — M79601 Pain in right arm: Secondary | ICD-10-CM | POA: Diagnosis not present

## 2021-08-10 DIAGNOSIS — R202 Paresthesia of skin: Secondary | ICD-10-CM | POA: Diagnosis not present

## 2021-08-10 DIAGNOSIS — M79621 Pain in right upper arm: Secondary | ICD-10-CM | POA: Diagnosis not present

## 2021-08-15 ENCOUNTER — Ambulatory Visit: Payer: 59 | Admitting: Family Medicine

## 2021-08-25 DIAGNOSIS — M25562 Pain in left knee: Secondary | ICD-10-CM | POA: Diagnosis not present

## 2021-09-08 DIAGNOSIS — M1712 Unilateral primary osteoarthritis, left knee: Secondary | ICD-10-CM | POA: Diagnosis not present

## 2022-01-26 DIAGNOSIS — I10 Essential (primary) hypertension: Secondary | ICD-10-CM | POA: Diagnosis not present

## 2022-01-26 DIAGNOSIS — T466X5A Adverse effect of antihyperlipidemic and antiarteriosclerotic drugs, initial encounter: Secondary | ICD-10-CM | POA: Diagnosis not present

## 2022-01-26 DIAGNOSIS — R0981 Nasal congestion: Secondary | ICD-10-CM | POA: Diagnosis not present

## 2022-01-26 DIAGNOSIS — N529 Male erectile dysfunction, unspecified: Secondary | ICD-10-CM | POA: Diagnosis not present

## 2022-01-26 DIAGNOSIS — Z20822 Contact with and (suspected) exposure to covid-19: Secondary | ICD-10-CM | POA: Diagnosis not present

## 2022-01-26 DIAGNOSIS — M791 Myalgia, unspecified site: Secondary | ICD-10-CM | POA: Diagnosis not present

## 2022-01-26 DIAGNOSIS — Z125 Encounter for screening for malignant neoplasm of prostate: Secondary | ICD-10-CM | POA: Diagnosis not present

## 2022-01-26 DIAGNOSIS — E1169 Type 2 diabetes mellitus with other specified complication: Secondary | ICD-10-CM | POA: Diagnosis not present

## 2022-01-26 DIAGNOSIS — E782 Mixed hyperlipidemia: Secondary | ICD-10-CM | POA: Diagnosis not present

## 2022-01-26 DIAGNOSIS — Z0000 Encounter for general adult medical examination without abnormal findings: Secondary | ICD-10-CM | POA: Diagnosis not present

## 2022-02-23 DIAGNOSIS — J208 Acute bronchitis due to other specified organisms: Secondary | ICD-10-CM | POA: Diagnosis not present

## 2022-06-14 DIAGNOSIS — M47816 Spondylosis without myelopathy or radiculopathy, lumbar region: Secondary | ICD-10-CM | POA: Diagnosis not present

## 2022-08-08 DIAGNOSIS — Z981 Arthrodesis status: Secondary | ICD-10-CM | POA: Diagnosis not present

## 2022-08-08 DIAGNOSIS — Z6834 Body mass index (BMI) 34.0-34.9, adult: Secondary | ICD-10-CM | POA: Diagnosis not present

## 2022-08-08 DIAGNOSIS — M47816 Spondylosis without myelopathy or radiculopathy, lumbar region: Secondary | ICD-10-CM | POA: Diagnosis not present

## 2022-08-11 ENCOUNTER — Other Ambulatory Visit: Payer: Self-pay | Admitting: Student

## 2022-08-11 DIAGNOSIS — M47816 Spondylosis without myelopathy or radiculopathy, lumbar region: Secondary | ICD-10-CM

## 2022-08-15 DIAGNOSIS — I10 Essential (primary) hypertension: Secondary | ICD-10-CM | POA: Diagnosis not present

## 2022-08-15 DIAGNOSIS — E1169 Type 2 diabetes mellitus with other specified complication: Secondary | ICD-10-CM | POA: Diagnosis not present

## 2022-08-17 ENCOUNTER — Encounter: Payer: Self-pay | Admitting: Physical Therapy

## 2022-08-17 ENCOUNTER — Other Ambulatory Visit: Payer: Self-pay

## 2022-08-17 ENCOUNTER — Ambulatory Visit: Payer: BC Managed Care – PPO | Attending: Student | Admitting: Physical Therapy

## 2022-08-17 DIAGNOSIS — M5459 Other low back pain: Secondary | ICD-10-CM | POA: Insufficient documentation

## 2022-08-17 DIAGNOSIS — R262 Difficulty in walking, not elsewhere classified: Secondary | ICD-10-CM | POA: Insufficient documentation

## 2022-08-17 DIAGNOSIS — R293 Abnormal posture: Secondary | ICD-10-CM | POA: Diagnosis not present
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
## 2022-08-23 ENCOUNTER — Ambulatory Visit (INDEPENDENT_AMBULATORY_CARE_PROVIDER_SITE_OTHER): Payer: BC Managed Care – PPO

## 2022-08-23 DIAGNOSIS — M47816 Spondylosis without myelopathy or radiculopathy, lumbar region: Secondary | ICD-10-CM

## 2022-08-23 DIAGNOSIS — M545 Low back pain, unspecified: Secondary | ICD-10-CM | POA: Diagnosis not present

## 2022-09-14 DIAGNOSIS — S32009K Unspecified fracture of unspecified lumbar vertebra, subsequent encounter for fracture with nonunion: Secondary | ICD-10-CM | POA: Diagnosis not present

## 2023-01-08 DIAGNOSIS — Z23 Encounter for immunization: Secondary | ICD-10-CM | POA: Diagnosis not present

## 2023-01-17 DIAGNOSIS — M545 Low back pain, unspecified: Secondary | ICD-10-CM | POA: Diagnosis not present

## 2023-02-15 DIAGNOSIS — N529 Male erectile dysfunction, unspecified: Secondary | ICD-10-CM | POA: Diagnosis not present

## 2023-02-15 DIAGNOSIS — E1169 Type 2 diabetes mellitus with other specified complication: Secondary | ICD-10-CM | POA: Diagnosis not present

## 2023-02-15 DIAGNOSIS — Z125 Encounter for screening for malignant neoplasm of prostate: Secondary | ICD-10-CM | POA: Diagnosis not present

## 2023-02-15 DIAGNOSIS — Z0000 Encounter for general adult medical examination without abnormal findings: Secondary | ICD-10-CM | POA: Diagnosis not present

## 2023-02-15 DIAGNOSIS — I10 Essential (primary) hypertension: Secondary | ICD-10-CM | POA: Diagnosis not present

## 2023-02-15 DIAGNOSIS — E782 Mixed hyperlipidemia: Secondary | ICD-10-CM | POA: Diagnosis not present

## 2023-02-16 DIAGNOSIS — M545 Low back pain, unspecified: Secondary | ICD-10-CM | POA: Diagnosis not present

## 2023-03-07 DIAGNOSIS — M5451 Vertebrogenic low back pain: Secondary | ICD-10-CM | POA: Diagnosis not present

## 2023-03-07 DIAGNOSIS — M545 Low back pain, unspecified: Secondary | ICD-10-CM | POA: Insufficient documentation

## 2023-03-28 DIAGNOSIS — Z981 Arthrodesis status: Secondary | ICD-10-CM | POA: Diagnosis not present

## 2023-03-28 DIAGNOSIS — M47816 Spondylosis without myelopathy or radiculopathy, lumbar region: Secondary | ICD-10-CM | POA: Diagnosis not present

## 2023-04-10 DIAGNOSIS — M5451 Vertebrogenic low back pain: Secondary | ICD-10-CM | POA: Diagnosis not present

## 2023-04-10 DIAGNOSIS — M545 Low back pain, unspecified: Secondary | ICD-10-CM | POA: Insufficient documentation

## 2023-04-10 DIAGNOSIS — T819XXA Unspecified complication of procedure, initial encounter: Secondary | ICD-10-CM | POA: Insufficient documentation

## 2023-05-09 ENCOUNTER — Other Ambulatory Visit: Payer: Self-pay

## 2023-05-16 DIAGNOSIS — M5451 Vertebrogenic low back pain: Secondary | ICD-10-CM | POA: Diagnosis not present

## 2023-05-16 DIAGNOSIS — M961 Postlaminectomy syndrome, not elsewhere classified: Secondary | ICD-10-CM | POA: Diagnosis not present

## 2023-05-18 ENCOUNTER — Other Ambulatory Visit: Payer: Self-pay

## 2023-05-18 ENCOUNTER — Encounter (HOSPITAL_COMMUNITY): Payer: Self-pay

## 2023-05-18 NOTE — Progress Notes (Signed)
VM left with Rosalva Ferron, OR scheduler for Dr. Shon Baton, to have MD place surgical orders.

## 2023-05-18 NOTE — Pre-Procedure Instructions (Signed)
Surgical Instructions   Your procedure is scheduled on Monday, February 3rd. Report to [facility] Main Entrance "A" at 05:30 A.M., then check in with the Admitting office. Any questions or running late day of surgery: call [phone]  Questions prior to your surgery date: call [phone], Monday-Friday, 8am-4pm. If you experience any cold or flu symptoms such as cough, fever, chills, shortness of breath, etc. between now and your scheduled surgery, please notify us at the above number.     Remember:  Do not eat after midnight the night before your surgery  You may drink clear liquids until 04:30 AM the morning of your surgery.   Clear liquids allowed are: Water, Non-Citrus Juices (without pulp), Carbonated Beverages, Clear Tea (no milk, honey, etc.), Black Coffee Only (NO MILK, CREAM OR POWDERED CREAMER of any kind), and Gatorade.    Take these medicines the morning of surgery with A SIP OF WATER  amLODipine (NORVASC)  cyclobenzaprine (FLEXERIL)   May take these medicines IF NEEDED:    One week prior to surgery, STOP taking any Aspirin (unless otherwise instructed by your surgeon) Aleve, Naproxen, Ibuprofen, Motrin, Advil, Goody's, BC's, all herbal medications, fish oil, and non-prescription vitamins.  WHAT DO I DO ABOUT MY DIABETES MEDICATION?   Do not take metFORMIN (GLUCOPHAGE) the morning of surgery.  Hold Jardiance for 72 hours prior to surgery. Last dose 1/30.         HOW TO MANAGE YOUR DIABETES BEFORE AND AFTER SURGERY  Why is it important to control my blood sugar before and after surgery? Improving blood sugar levels before and after surgery helps healing and can limit problems. A way of improving blood sugar control is eating a healthy diet by:  Eating less sugar and carbohydrates  Increasing activity/exercise  Talking with your doctor about reaching your blood sugar goals High blood sugars (greater than 180 mg/dL) can raise your risk of infections and  slow your recovery, so you will need to focus on controlling your diabetes during the weeks before surgery. Make sure that the doctor who takes care of your diabetes knows about your planned surgery including the date and location.  How do I manage my blood sugar before surgery? Check your blood sugar at least 4 times a day, starting 2 days before surgery, to make sure that the level is not too high or low.  Check your blood sugar the morning of your surgery when you wake up and every 2 hours until you get to the Short Stay unit.  If your blood sugar is less than 70 mg/dL, you will need to treat for low blood sugar: Do not take insulin. Treat a low blood sugar (less than 70 mg/dL) with  cup of clear juice (cranberry or apple), 4 glucose tablets, OR glucose gel. Recheck blood sugar in 15 minutes after treatment (to make sure it is greater than 70 mg/dL). If your blood sugar is not greater than 70 mg/dL on recheck, call [phone] for further instructions. Report your blood sugar to the short stay nurse when you get to Short Stay.  If you are admitted to the hospital after surgery: Your blood sugar will be checked by the staff and you will probably be given insulin after surgery (instead of oral diabetes medicines) to make sure you have good blood sugar levels. The goal for blood sugar control after surgery is 80-180 mg/dL.  Do NOT Smoke (Tobacco/Vaping) for 24 hours prior to your procedure.  If you use a CPAP at night, you may bring your mask/headgear for your overnight stay.   You will be asked to remove any contacts, glasses, piercing's, hearing aid's, dentures/partials prior to surgery. Please bring cases for these items if needed.    Patients discharged the day of surgery will not be allowed to drive home, and someone needs to stay with them for 24 hours.  SURGICAL WAITING ROOM VISITATION Patients may have no more than 2 support people in the waiting area - these  visitors may rotate.   Pre-op nurse will coordinate an appropriate time for 1 ADULT support person, who may not rotate, to accompany patient in pre-op.  Children under the age of 74 must have an adult with them who is not the patient and must remain in the main waiting area with an adult.  If the patient needs to stay at the hospital during part of their recovery, the visitor guidelines for inpatient rooms apply.  Please refer to the [facility] website for the visitor guidelines for any additional information.   If you received a COVID test during your pre-op visit  it is requested that you wear a mask when out in public, stay away from anyone that may not be feeling well and notify your surgeon if you develop symptoms. If you have been in contact with anyone that has tested positive in the last 10 days please notify you surgeon.      Pre-operative 5 CHG Bathing Instructions   You can play a key role in reducing the risk of infection after surgery. Your skin needs to be as free of germs as possible. You can reduce the number of germs on your skin by washing with CHG (chlorhexidine gluconate) soap before surgery. CHG is an antiseptic soap that kills germs and continues to kill germs even after washing.   DO NOT use if you have an allergy to chlorhexidine/CHG or antibacterial soaps. If your skin becomes reddened or irritated, stop using the CHG and notify one of our RNs at [phone].   Please shower with the CHG soap starting 4 days before surgery using the following schedule:     Please keep in mind the following:  DO NOT shave, including legs and underarms, starting the day of your first shower.   You may shave your face at any point before/day of surgery.  Place clean sheets on your bed the day you start using CHG soap. Use a clean washcloth (not used since being washed) for each shower. DO NOT sleep with pets once you start using the CHG.   CHG Shower Instructions:  Wash your  face and private area with normal soap. If you choose to wash your hair, wash first with your normal shampoo.  After you use shampoo/soap, rinse your hair and body thoroughly to remove shampoo/soap residue.  Turn the water OFF and apply about 3 tablespoons (45 ml) of CHG soap to a CLEAN washcloth.  Apply CHG soap ONLY FROM YOUR NECK DOWN TO YOUR TOES (washing for 3-5 minutes)  DO NOT use CHG soap on face, private areas, open wounds, or sores.  Pay special attention to the area where your surgery is being performed.  If you are having back surgery, having someone wash your back for you may be helpful. Wait 2 minutes after CHG soap is applied, then you may rinse off the CHG soap.  Pat dry with a  clean towel  Put on clean clothes/pajamas   If you choose to wear lotion, please use ONLY the CHG-compatible lotions that are listed below.  Additional instructions for the day of surgery: DO NOT APPLY any lotions, deodorants, cologne, or perfumes.   Do not bring valuables to the hospital. [facility] is not responsible for any belongings/valuables. Do not wear nail polish, gel polish, artificial nails, or any other type of covering on natural nails (fingers and toes) Do not wear jewelry or makeup Put on clean/comfortable clothes.  Please brush your teeth.  Ask your nurse before applying any prescription medications to the skin.     CHG Compatible Lotions   Aveeno Moisturizing lotion  Cetaphil Moisturizing Cream  Cetaphil Moisturizing Lotion  Clairol Herbal Essence Moisturizing Lotion, Dry Skin  Clairol Herbal Essence Moisturizing Lotion, Extra Dry Skin  Clairol Herbal Essence Moisturizing Lotion, Normal Skin  Curel Age Defying Therapeutic Moisturizing Lotion with Alpha Hydroxy  Curel Extreme Care Body Lotion  Curel Soothing Hands Moisturizing Hand Lotion  Curel Therapeutic Moisturizing Cream, Fragrance-Free  Curel Therapeutic Moisturizing Lotion, Fragrance-Free  Curel Therapeutic  Moisturizing Lotion, Original Formula  Eucerin Daily Replenishing Lotion  Eucerin Dry Skin Therapy Plus Alpha Hydroxy Crme  Eucerin Dry Skin Therapy Plus Alpha Hydroxy Lotion  Eucerin Original Crme  Eucerin Original Lotion  Eucerin Plus Crme Eucerin Plus Lotion  Eucerin TriLipid Replenishing Lotion  Keri Anti-Bacterial Hand Lotion  Keri Deep Conditioning Original Lotion Dry Skin Formula Softly Scented  Keri Deep Conditioning Original Lotion, Fragrance Free Sensitive Skin Formula  Keri Lotion Fast Absorbing Fragrance Free Sensitive Skin Formula  Keri Lotion Fast Absorbing Softly Scented Dry Skin Formula  Keri Original Lotion  Keri Skin Renewal Lotion Keri Silky Smooth Lotion  Keri Silky Smooth Sensitive Skin Lotion  Nivea Body Creamy Conditioning Oil  Nivea Body Extra Enriched Lotion  Nivea Body Original Lotion  Nivea Body Sheer Moisturizing Lotion Nivea Crme  Nivea Skin Firming Lotion  NutraDerm 30 Skin Lotion  NutraDerm Skin Lotion  NutraDerm Therapeutic Skin Cream  NutraDerm Therapeutic Skin Lotion  ProShield Protective Hand Cream  Provon moisturizing lotion  Please read over the following fact sheets that you were given.

## 2023-05-21 ENCOUNTER — Encounter (HOSPITAL_COMMUNITY)
Admission: RE | Admit: 2023-05-21 | Discharge: 2023-05-21 | Disposition: A | Discharge status: Home / Self Care | Payer: BC Managed Care – PPO | Source: Ambulatory Visit | Attending: Orthopedic Surgery

## 2023-05-21 ENCOUNTER — Ambulatory Visit (HOSPITAL_COMMUNITY): Payer: Self-pay | Admitting: Orthopedic Surgery

## 2023-05-21 ENCOUNTER — Encounter (HOSPITAL_COMMUNITY): Payer: Self-pay

## 2023-05-21 ENCOUNTER — Other Ambulatory Visit: Payer: Self-pay

## 2023-05-21 VITALS — BP 157/92 | HR 102 | Temp 98.4°F | Resp 18 | Ht 69.0 in | Wt 184.4 lb

## 2023-05-21 DIAGNOSIS — E119 Type 2 diabetes mellitus without complications: Secondary | ICD-10-CM | POA: Insufficient documentation

## 2023-05-21 DIAGNOSIS — Z01818 Encounter for other preprocedural examination: Secondary | ICD-10-CM | POA: Diagnosis not present

## 2023-05-21 LAB — TYPE AND SCREEN
ABO/RH(D): A POS
Antibody Screen: NEGATIVE

## 2023-05-21 LAB — CBC
HCT: 46.1 % (ref 39.0–52.0)
Hemoglobin: 15.7 g/dL (ref 13.0–17.0)
MCH: 30.9 pg (ref 26.0–34.0)
MCHC: 34.1 g/dL (ref 30.0–36.0)
MCV: 90.7 fL (ref 80.0–100.0)
Platelets: 336 10*3/uL (ref 150–400)
RBC: 5.08 MIL/uL (ref 4.22–5.81)
RDW: 13.7 % (ref 11.5–15.5)
WBC: 5.2 10*3/uL (ref 4.0–10.5)
nRBC: 0 % (ref 0.0–0.2)

## 2023-05-21 LAB — BASIC METABOLIC PANEL
Anion gap: 10 (ref 5–15)
BUN: 15 mg/dL (ref 6–20)
CO2: 26 mmol/L (ref 22–32)
Calcium: 10 mg/dL (ref 8.9–10.3)
Chloride: 102 mmol/L (ref 98–111)
Creatinine, Ser: 0.99 mg/dL (ref 0.61–1.24)
GFR, Estimated: >60 mL/min (ref >60)
Glucose, Bld: 120 mg/dL — ABNORMAL HIGH (ref 70–99)
Potassium: 4.2 mmol/L (ref 3.5–5.1)
Sodium: 138 mmol/L (ref 135–145)

## 2023-05-21 LAB — GLUCOSE, CAPILLARY: Glucose-Capillary: 156 mg/dL — ABNORMAL HIGH (ref 70–99)

## 2023-05-21 LAB — SURGICAL PCR SCREEN
MRSA, PCR: NEGATIVE
Staphylococcus aureus: NEGATIVE

## 2023-05-21 LAB — HEMOGLOBIN A1C
Hgb A1c MFr Bld: 7.1 % — ABNORMAL HIGH (ref 4.8–5.6)
Mean Plasma Glucose: 157.07 mg/dL

## 2023-05-21 NOTE — Progress Notes (Signed)
PCP - Peri Maris, NP Cardiologist - denies  PPM/ICD - denies   Chest x-ray - 06/02/11 EKG - 05/21/23 Stress Test - denies ECHO - denies Cardiac Cath - denies  Sleep Study - denies   DM- pt does not check CBG at home and does not know typical fasting levels  Last dose of GLP1 agonist-  n/a   ASA/Blood Thinner Instructions: n/a   ERAS Protcol - yes, no drink   COVID TEST- n/a   Anesthesia review: no  Patient denies shortness of breath, fever, cough and chest pain at PAT appointment   All instructions explained to the patient, with a verbal understanding of the material. Patient agrees to go over the instructions while at home for a better understanding.  The opportunity to ask questions was provided.

## 2023-05-22 ENCOUNTER — Encounter: Payer: Self-pay | Admitting: Vascular Surgery

## 2023-05-22 ENCOUNTER — Ambulatory Visit: Payer: BC Managed Care – PPO | Admitting: Vascular Surgery

## 2023-05-22 VITALS — BP 131/81 | HR 98 | Temp 97.8°F | Resp 18 | Ht 69.0 in | Wt 184.6 lb

## 2023-05-22 DIAGNOSIS — M48061 Spinal stenosis, lumbar region without neurogenic claudication: Secondary | ICD-10-CM | POA: Diagnosis not present

## 2023-05-22 DIAGNOSIS — M545 Low back pain, unspecified: Secondary | ICD-10-CM | POA: Diagnosis not present

## 2023-05-22 DIAGNOSIS — S32009K Unspecified fracture of unspecified lumbar vertebra, subsequent encounter for fracture with nonunion: Secondary | ICD-10-CM
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
## 2023-05-27 ENCOUNTER — Encounter (HOSPITAL_COMMUNITY): Payer: Self-pay | Admitting: Orthopedic Surgery

## 2023-05-27 NOTE — Anesthesia Preprocedure Evaluation (Signed)
Anesthesia Evaluation  Patient identified by MRN, date of birth, ID band Patient awake    Reviewed: Allergy & Precautions, NPO status , Patient's Chart, lab work & pertinent test results, reviewed documented beta blocker date and time   Airway Mallampati: III  TM Distance: >3 FB     Dental no notable dental hx.    Pulmonary neg pulmonary ROS   Pulmonary exam normal breath sounds clear to auscultation       Cardiovascular hypertension, Pt. 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 Lumbar spinal stenosis Spondylolisthesis L5-S1 Low back pain   Abdominal   Peds  Hematology   Anesthesia Other Findings   Reproductive/Obstetrics                              Anesthesia Physical Anesthesia Plan  ASA: 2  Anesthesia Plan: General   Post-op Pain Management: Regional block*, Dilaudid IV, Precedex, Ketamine IV* and Ofirmev IV (intra-op)*   Induction: Intravenous  PONV Risk Score and Plan: 4 or greater and Treatment may vary due to age or medical condition, Midazolam, Ondansetron and Dexamethasone  Airway Management Planned: Oral ETT  Additional Equipment: None  Intra-op Plan:   Post-operative Plan: Extubation in OR  Informed Consent: I have reviewed the patients History and Physical, chart, labs and discussed the procedure including the risks, benefits and alternatives for the proposed anesthesia with the patient or authorized representative who has indicated his/her understanding and acceptance.     Dental advisory given  Plan Discussed with: CRNA  and Anesthesiologist  Anesthesia Plan Comments:         Anesthesia Quick Evaluation

## 2023-05-28 ENCOUNTER — Encounter (HOSPITAL_COMMUNITY): Payer: Self-pay | Admitting: Orthopedic Surgery

## 2023-05-28 ENCOUNTER — Other Ambulatory Visit: Payer: Self-pay

## 2023-05-28 ENCOUNTER — Inpatient Hospital Stay (HOSPITAL_COMMUNITY): Payer: BC Managed Care – PPO

## 2023-05-28 ENCOUNTER — Inpatient Hospital Stay (HOSPITAL_COMMUNITY): Payer: Self-pay | Admitting: Anesthesiology

## 2023-05-28 ENCOUNTER — Encounter (HOSPITAL_COMMUNITY)
Admission: RE | Disposition: A | Discharge status: Home / Self Care | Payer: Self-pay | Source: Home / Self Care | Attending: Orthopedic Surgery

## 2023-05-28 ENCOUNTER — Ambulatory Visit (HOSPITAL_COMMUNITY)
Admission: RE | Admit: 2023-05-28 | Discharge: 2023-05-28 | Disposition: A | Discharge status: Home / Self Care | Payer: BC Managed Care – PPO | Attending: Orthopedic Surgery | Admitting: Orthopedic Surgery

## 2023-05-28 DIAGNOSIS — M961 Postlaminectomy syndrome, not elsewhere classified: Secondary | ICD-10-CM | POA: Diagnosis not present

## 2023-05-28 DIAGNOSIS — Z5309 Procedure and treatment not carried out because of other contraindication: Secondary | ICD-10-CM | POA: Insufficient documentation

## 2023-05-28 DIAGNOSIS — Z79899 Other long term (current) drug therapy: Secondary | ICD-10-CM | POA: Insufficient documentation

## 2023-05-28 DIAGNOSIS — M545 Low back pain, unspecified: Secondary | ICD-10-CM | POA: Insufficient documentation

## 2023-05-28 DIAGNOSIS — G8929 Other chronic pain: Secondary | ICD-10-CM | POA: Diagnosis not present

## 2023-05-28 DIAGNOSIS — Z7984 Long term (current) use of oral hypoglycemic drugs: Secondary | ICD-10-CM | POA: Insufficient documentation

## 2023-05-28 DIAGNOSIS — M960 Pseudarthrosis after fusion or arthrodesis: Secondary | ICD-10-CM | POA: Insufficient documentation

## 2023-05-28 DIAGNOSIS — I10 Essential (primary) hypertension: Secondary | ICD-10-CM | POA: Insufficient documentation

## 2023-05-28 DIAGNOSIS — E119 Type 2 diabetes mellitus without complications: Secondary | ICD-10-CM | POA: Diagnosis not present

## 2023-05-28 LAB — GLUCOSE, CAPILLARY
Glucose-Capillary: 150 mg/dL — ABNORMAL HIGH (ref 70–99)
Glucose-Capillary: 141 mg/dL — ABNORMAL HIGH (ref 70–99)

## 2023-05-28 SURGERY — CANCELLED PROCEDURE
Anesthesia: General

## 2023-05-28 MED ORDER — TRANEXAMIC ACID-NACL 1000-0.7 MG/100ML-% IV SOLN
1000.0000 mg | INTRAVENOUS | Status: AC
  Administered 2023-05-28: 1000 mg via INTRAVENOUS
  Filled 2023-05-28: qty 100

## 2023-05-28 MED ORDER — LIDOCAINE 2% (20 MG/ML) 5 ML SYRINGE
INTRAMUSCULAR | Status: DC | PRN
  Administered 2023-05-28: 100 mg via INTRAVENOUS

## 2023-05-28 MED ORDER — ROCURONIUM BROMIDE 10 MG/ML (PF) SYRINGE
PREFILLED_SYRINGE | INTRAVENOUS | Status: DC | PRN
  Administered 2023-05-28: 50 mg via INTRAVENOUS

## 2023-05-28 MED ORDER — CHLORHEXIDINE GLUCONATE 0.12 % MT SOLN
15.0000 mL | Freq: Once | OROMUCOSAL | Status: AC
  Administered 2023-05-28: 15 mL via OROMUCOSAL
  Filled 2023-05-28: qty 15

## 2023-05-28 MED ORDER — 0.9 % SODIUM CHLORIDE (POUR BTL) OPTIME
TOPICAL | Status: DC | PRN
  Administered 2023-05-28: .001 mL

## 2023-05-28 MED ORDER — CEFAZOLIN SODIUM-DEXTROSE 2-4 GM/100ML-% IV SOLN
2.0000 g | INTRAVENOUS | Status: AC
  Administered 2023-05-28: 2 g via INTRAVENOUS
  Filled 2023-05-28: qty 100

## 2023-05-28 MED ORDER — HYDROMORPHONE HCL 1 MG/ML IJ SOLN: 0.2500 mg | INTRAMUSCULAR | Status: DC | PRN

## 2023-05-28 MED ORDER — BUPIVACAINE HCL (PF) 0.5 % IJ SOLN: INTRAMUSCULAR | Status: DC | PRN

## 2023-05-28 MED ORDER — BUPIVACAINE LIPOSOME 1.3 % IJ SUSP
INTRAMUSCULAR | Status: AC
  Filled 2023-05-28: qty 20

## 2023-05-28 MED ORDER — METHOCARBAMOL 1000 MG/10ML IJ SOLN: 1000.0000 mg | Freq: Once | INTRAMUSCULAR | Status: DC | PRN

## 2023-05-28 MED ORDER — CHLORHEXIDINE GLUCONATE CLOTH 2 % EX PADS: 6.0000 | MEDICATED_PAD | Freq: Once | CUTANEOUS | Status: DC

## 2023-05-28 MED ORDER — THROMBIN 20000 UNITS EX SOLR
CUTANEOUS | Status: AC
  Filled 2023-05-28: qty 20000

## 2023-05-28 MED ORDER — ONDANSETRON HCL 4 MG/2ML IJ SOLN
INTRAMUSCULAR | Status: DC | PRN
  Administered 2023-05-28: 4 mg via INTRAVENOUS

## 2023-05-28 MED ORDER — BUPIVACAINE HCL (PF) 0.5 % IJ SOLN
INTRAMUSCULAR | Status: DC | PRN
  Administered 2023-05-28: 20 mL via PERINEURAL

## 2023-05-28 MED ORDER — LACTATED RINGERS IV SOLN: INTRAVENOUS | Status: DC

## 2023-05-28 MED ORDER — MIDAZOLAM HCL 2 MG/2ML IJ SOLN
INTRAMUSCULAR | Status: AC
  Filled 2023-05-28: qty 2

## 2023-05-28 MED ORDER — BUPIVACAINE LIPOSOME 1.3 % IJ SUSP
INTRAMUSCULAR | Status: DC | PRN
  Administered 2023-05-28: 10 mL via PERINEURAL

## 2023-05-28 MED ORDER — MIDAZOLAM HCL 2 MG/2ML IJ SOLN
INTRAMUSCULAR | Status: DC | PRN
  Administered 2023-05-28: 2 mg via INTRAVENOUS

## 2023-05-28 MED ORDER — DEXAMETHASONE SODIUM PHOSPHATE 10 MG/ML IJ SOLN
INTRAMUSCULAR | Status: DC | PRN
  Administered 2023-05-28: 10 mg via INTRAVENOUS

## 2023-05-28 MED ORDER — PROPOFOL 10 MG/ML IV BOLUS
INTRAVENOUS | Status: AC
  Filled 2023-05-28: qty 20

## 2023-05-28 MED ORDER — PROPOFOL 10 MG/ML IV BOLUS
INTRAVENOUS | Status: DC | PRN
  Administered 2023-05-28: 200 mg via INTRAVENOUS

## 2023-05-28 MED ORDER — ORAL CARE MOUTH RINSE: 15.0000 mL | Freq: Once | OROMUCOSAL | Status: AC

## 2023-05-28 MED ORDER — OXYCODONE HCL 5 MG/5ML PO SOLN: 5.0000 mg | Freq: Once | ORAL | Status: DC | PRN

## 2023-05-28 MED ORDER — BUPIVACAINE-EPINEPHRINE 0.25% -1:200000 IJ SOLN
INTRAMUSCULAR | Status: DC | PRN
  Administered 2023-05-28: .01 mg

## 2023-05-28 MED ORDER — INSULIN ASPART 100 UNIT/ML IJ SOLN: 0.0000 [IU] | INTRAMUSCULAR | Status: DC | PRN

## 2023-05-28 MED ORDER — OXYCODONE HCL 5 MG PO TABS: 5.0000 mg | ORAL_TABLET | Freq: Once | ORAL | Status: DC | PRN

## 2023-05-28 MED ORDER — FENTANYL CITRATE (PF) 250 MCG/5ML IJ SOLN
INTRAMUSCULAR | Status: AC
  Filled 2023-05-28: qty 5

## 2023-05-28 MED ORDER — SUGAMMADEX SODIUM 200 MG/2ML IV SOLN
INTRAVENOUS | Status: DC | PRN
  Administered 2023-05-28: 300 mg via INTRAVENOUS

## 2023-05-28 MED ORDER — HEPARIN 6000 UNIT IRRIGATION SOLUTION
Status: AC
  Filled 2023-05-28: qty 500

## 2023-05-28 MED ORDER — BUPIVACAINE-EPINEPHRINE (PF) 0.25% -1:200000 IJ SOLN
INTRAMUSCULAR | Status: AC
  Filled 2023-05-28: qty 30

## 2023-05-28 MED ORDER — FENTANYL CITRATE (PF) 250 MCG/5ML IJ SOLN
INTRAMUSCULAR | Status: DC | PRN
  Administered 2023-05-28: 50 ug via INTRAVENOUS

## 2023-05-28 MED ORDER — LACTATED RINGERS IV SOLN: INTRAVENOUS | Status: DC | PRN

## 2023-05-28 MED ORDER — DROPERIDOL 2.5 MG/ML IJ SOLN: 0.6250 mg | Freq: Once | INTRAMUSCULAR | Status: DC | PRN

## 2023-05-28 MED ORDER — THROMBIN 20000 UNITS EX SOLR
CUTANEOUS | Status: DC | PRN
  Administered 2023-05-28: .01 [IU] via TOPICAL

## 2023-05-28 MED ORDER — ONDANSETRON HCL 4 MG/2ML IJ SOLN: 4.0000 mg | Freq: Once | INTRAMUSCULAR | Status: DC | PRN

## 2023-05-28 MED ORDER — PROPOFOL 500 MG/50ML IV EMUL
INTRAVENOUS | Status: DC | PRN
  Administered 2023-05-28: 130 ug/kg/min via INTRAVENOUS

## 2023-05-28 SURGICAL SUPPLY — 71 items
APPLIER CLIP 11 MED OPEN (CLIP)
BAG COUNTER SPONGE SURGICOUNT (BAG) ×2 IMPLANT
BLADE CLIPPER SURG (BLADE) IMPLANT
BLADE SURG 10 STRL SS (BLADE) ×2 IMPLANT
CLIP APPLIE 11 MED OPEN (CLIP) ×2 IMPLANT
CLIP LIGATING EXTRA MED SLVR (CLIP) IMPLANT
CORD BIPOLAR FORCEPS 12FT (ELECTRODE) ×1 IMPLANT
COVER SURGICAL LIGHT HANDLE (MISCELLANEOUS) ×2 IMPLANT
DRAIN CHANNEL 15F RND FF W/TCR (WOUND CARE) IMPLANT
DRAPE C-ARM 42X72 X-RAY (DRAPES) ×2 IMPLANT
DRAPE C-ARMOR (DRAPES) ×2 IMPLANT
DRAPE INCISE IOBAN 66X45 STRL (DRAPES) ×1 IMPLANT
DRAPE LAPAROTOMY T 102X78X121 (DRAPES) ×1 IMPLANT
DRAPE SURG 17X23 STRL (DRAPES) ×2 IMPLANT
DRAPE U-SHAPE 47X51 STRL (DRAPES) ×2 IMPLANT
DRSG AQUACEL AG ADV 3.5X 6 (GAUZE/BANDAGES/DRESSINGS) ×2 IMPLANT
DURAPREP 26ML APPLICATOR (WOUND CARE) ×2 IMPLANT
ELECT BLADE 4.0 EZ CLEAN MEGAD (MISCELLANEOUS)
ELECT CAUTERY BLADE 6.4 (BLADE) ×1 IMPLANT
ELECT PENCIL ROCKER SW 15FT (MISCELLANEOUS) ×2 IMPLANT
ELECT REM PT RETURN 9FT ADLT (ELECTROSURGICAL)
ELECTRODE BLDE 4.0 EZ CLN MEGD (MISCELLANEOUS) ×2 IMPLANT
ELECTRODE REM PT RTRN 9FT ADLT (ELECTROSURGICAL) ×2 IMPLANT
GLOVE BIO SURGEON STRL SZ 6.5 (GLOVE) ×2 IMPLANT
GLOVE BIO SURGEON STRL SZ7.5 (GLOVE) ×1 IMPLANT
GLOVE BIOGEL PI IND STRL 6.5 (GLOVE) ×2 IMPLANT
GLOVE BIOGEL PI IND STRL 8 (GLOVE) ×1 IMPLANT
GLOVE BIOGEL PI IND STRL 8.5 (GLOVE) ×2 IMPLANT
GLOVE OPTIFIT SS 7.5 STRL LX (GLOVE) ×1 IMPLANT
GLOVE SS BIOGEL STRL SZ 8.5 (GLOVE) ×2 IMPLANT
GOWN STRL REUS W/ TWL LRG LVL3 (GOWN DISPOSABLE) ×2 IMPLANT
GOWN STRL REUS W/ TWL XL LVL3 (GOWN DISPOSABLE) ×1 IMPLANT
GOWN STRL REUS W/TWL 2XL LVL3 (GOWN DISPOSABLE) ×2 IMPLANT
INSERT FOGARTY 61MM (MISCELLANEOUS) IMPLANT
INSERT FOGARTY SM (MISCELLANEOUS) IMPLANT
KIT BASIN OR (CUSTOM PROCEDURE TRAY) ×1 IMPLANT
KIT POSITION SURG JACKSON T1 (MISCELLANEOUS) ×1 IMPLANT
KIT TURNOVER KIT B (KITS) ×1 IMPLANT
MARKER PEN SURG W/LABELS BLK (STERILIZATION PRODUCTS) ×1 IMPLANT
NDL HYPO 22X1.5 SAFETY MO (MISCELLANEOUS) ×1 IMPLANT
NEEDLE HYPO 22X1.5 SAFETY MO (MISCELLANEOUS)
NS IRRIG 1000ML POUR BTL (IV SOLUTION) ×1 IMPLANT
PACK LAMINECTOMY ORTHO (CUSTOM PROCEDURE TRAY) ×1 IMPLANT
PACK UNIVERSAL I (CUSTOM PROCEDURE TRAY) ×2 IMPLANT
PAD ARMBOARD 7.5X6 YLW CONV (MISCELLANEOUS) ×4 IMPLANT
PATTIES SURGICAL .5 X1 (DISPOSABLE) IMPLANT
SPONGE INTESTINAL PEANUT (DISPOSABLE) ×4 IMPLANT
SPONGE SURGIFOAM ABS GEL 100 (HEMOSTASIS) ×1 IMPLANT
SPONGE T-LAP 18X18 ~~LOC~~+RFID (SPONGE) ×1 IMPLANT
STRIP CLOSURE SKIN 1/2X4 (GAUZE/BANDAGES/DRESSINGS) ×2 IMPLANT
SURGIFLO W/THROMBIN 8M KIT (HEMOSTASIS) IMPLANT
SUT MNCRL AB 3-0 PS2 27 (SUTURE) ×2 IMPLANT
SUT PDS AB 1 CTX 36 (SUTURE) ×2 IMPLANT
SUT PROLENE 4-0 RB1 .5 CRCL 36 (SUTURE) IMPLANT
SUT PROLENE 5 0 CC1 (SUTURE) IMPLANT
SUT PROLENE 6 0 C 1 30 (SUTURE) IMPLANT
SUT PROLENE 6 0 CC (SUTURE) IMPLANT
SUT SILK 0 TIES 10X30 (SUTURE) IMPLANT
SUT SILK 2 0 TIES 10X30 (SUTURE) ×2 IMPLANT
SUT SILK 2 0SH CR/8 30 (SUTURE) IMPLANT
SUT SILK 3 0 TIES 10X30 (SUTURE) ×1 IMPLANT
SUT SILK 3 0SH CR/8 30 (SUTURE) IMPLANT
SUT SILK 3-0 18XBRD TIE BLK (SUTURE) IMPLANT
SUT VIC AB 2-0 CT1 18 (SUTURE) ×2 IMPLANT
SYR BULB IRRIG 60ML STRL (SYRINGE) ×1 IMPLANT
SYR CONTROL 10ML LL (SYRINGE) ×1 IMPLANT
TOWEL GREEN STERILE (TOWEL DISPOSABLE) ×3 IMPLANT
TOWEL GREEN STERILE FF (TOWEL DISPOSABLE) ×1 IMPLANT
TRAY FOLEY W/BAG SLVR 16FR ST (SET/KITS/TRAYS/PACK) ×1 IMPLANT
WATER STERILE IRR 1000ML POUR (IV SOLUTION) ×1 IMPLANT
YANKAUER SUCT BULB TIP NO VENT (SUCTIONS) ×1 IMPLANT

## 2023-05-28 NOTE — Anesthesia Postprocedure Evaluation (Signed)
Anesthesia Post Note  Patient: Gary Luna  Procedure(s) Performed: CANCELLED PROCEDURE     Patient location during evaluation: PACU Anesthesia Type: General Level of consciousness: awake and alert and oriented Pain management: pain level controlled Vital Signs Assessment: post-procedure vital signs reviewed and stable Respiratory status: spontaneous breathing, nonlabored ventilation and respiratory function stable Cardiovascular status: blood pressure returned to baseline and stable Postop Assessment: no apparent nausea or vomiting Anesthetic complications: no Comments: Surgery cancelled due to surgical instrument problem.   There were no known notable events for this encounter.  Last Vitals:  Vitals:   05/28/23 0845 05/28/23 0900  BP: 134/82 (!) 139/96  Pulse: 82 72  Resp: 18 14  Temp:  36.6 C  SpO2: 93% 94%    Last Pain:  Vitals:   05/28/23 0835  PainSc: 0-No pain                 Quay Simkin A.

## 2023-05-28 NOTE — Anesthesia Procedure Notes (Addendum)
  Anesthesia Regional Block: TAP block   Pre-Anesthetic Checklist: , timeout performed,  Correct Patient, Correct Site, Correct Laterality,  Correct Procedure, Correct Position, site marked,  Risks and benefits discussed,  Surgical consent,  Pre-op evaluation,  At surgeon's request and post-op pain management  Laterality: Left  Prep: chloraprep       Needles:  Injection technique: Single-shot  Needle Type: Echogenic Stimulator Needle     Needle Length: 10cm  Needle Gauge: 21   Needle insertion depth: 7 cm   Additional Needles:   Procedures:,,,, ultrasound used (permanent image in chart),,    Narrative:  Start time: 05/28/2023 7:25 AM End time: 05/28/2023 7:30 AM Injection made incrementally with aspirations every 5 mL.  Performed by: Personally  Anesthesiologist: Mal Amabile, MD  Additional Notes: Timeout performed. Patient sedated. Relevant anatomy ID'd using [place]. Incremental 2-25ml injection of LA with frequent aspiration. Patient tolerated procedure well.
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
## 2023-05-28 NOTE — Transfer of Care (Signed)
Immediate Anesthesia Transfer of Care Note  Patient: Gary Luna  Procedure(s) Performed: CANCELLED PROCEDURE  Patient Location: PACU  Anesthesia Type:General and Regional  Level of Consciousness: awake, alert , and oriented  Airway & Oxygen Therapy: Patient Spontanous Breathing and Patient connected to face mask oxygen  Post-op Assessment: Report given to RN and Post -op Vital signs reviewed and stable  Post vital signs: Reviewed and stable  Last Vitals:  Vitals Value Taken Time  BP 142/89 05/28/23 0835  Temp 36.5 C 05/28/23 0835  Pulse 79 05/28/23 0839  Resp 14 05/28/23 0839  SpO2 96 % 05/28/23 0839  Vitals shown include unfiled device data.  Last Pain:  Vitals:   05/28/23 0835  PainSc: 0-No pain      Patients Stated Pain Goal: 4 (05/28/23 0618)  Complications: No notable events documented.

## 2023-05-28 NOTE — H&P (Signed)
History: The patient is a 58 year old male who presents for pre-operative visit in preparation for their removal of screw L3-SI, cage removal L5-SI ALIF w/ autograft, PLF L5-SI, which is scheduled on 05-28-23 with Dr. Venita Lick, MD at [facility]. The patient has had symptoms in the Back including pain which has impacted their quality of life and ability to do activities of daily living. 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 The patient has had no previous surgery . The patient denies an active infection.  Past Medical History:  Diagnosis Date   Arthritis    Diabetes mellitus without complication (HCC)    Dysrhythmia 2011   no tests done   Hypertension     Allergies  Allergen Reactions   Atorvastatin Other (See Comments)    No current facility-administered medications on file prior to encounter.   Current Outpatient Medications on File Prior to Encounter  Medication Sig Dispense Refill   acetaminophen (TYLENOL) 500 MG tablet Take 500-1,000 mg by mouth every 6 (six) hours as needed for moderate pain (pain score 4-6).     benazepril-hydrochlorthiazide (LOTENSIN HCT) 20-25 MG tablet Take 1 tablet by mouth daily.     Cholecalciferol (D3 PO) Take 1 tablet by mouth daily.     Multiple Vitamins-Minerals (MULTIVITAMINS THER. W/MINERALS) TABS Take 1 tablet by mouth daily.     omega-3 acid ethyl esters (LOVAZA) 1 g capsule Take 1 capsule by mouth daily.   12    Physical Exam: Vitals:   05/28/23 0625  BP: (!) 152/102  Pulse: 75  Resp: 18  Temp: 98.2 F (36.8 C)  SpO2: 97%   Body mass index is 26.29 kg/m. Clinical exam: Gary Luna is a pleasant individual, who appears younger than their stated age.  He is alert and orientated 3.  No shortness of breath, chest pain.  Abdomen is soft and non-tender, negative loss of bowel and bladder control, no rebound  tenderness.  Negative: skin lesions abrasions contusions  Peripheral pulses: 2+ peripheral pulses. LE compartments are: Soft and nontender.  Gait pattern: Normal  Assistive devices: None  Neuro: 5/5 motor strength in the lower extremity bilaterally. Negative straight leg raise test. No clonus, negative Babinski test. Normal sensation light touch.  Musculoskeletal: Significant back pain with palpation and range of motion. Pain is midline radiating into the paraspinal region. Well-healed surgical scars from prior surgeries. No SI joint pain.  Imaging: X-rays of the lumbar spine demonstrate intervertebral fusion with cages L3-S1. There is lysis noted around the L5-S1 intervertebral cage. Patient has posterior pedicle screw fixation L3-S1. Minimal adjacent L2-3 degenerative disease.  CT scan of the lumbar spine: completed on 08/29/2022. Pseudoarthrosis is noted at L5-S1. Bilateral vacuum phenomenon at the S1 pedicle screws. Moderate spinal stenosis at L2-3. Moderate residual stenosis at L3-4. Solid L3-4 fusion. Solid L4-5 fusion with no residual stenosis. Vacuum phenomenon and lysis noted around the intervertebral cage consistent with a pseudoarthrosis at L5-S1.  CT scan of the lumbar spine completed on 03/28/2023 There appears to be no significant change when compared to his prior CT scan. They has a persistent nonunion at L5-S1 with vacuum phenomenon surrounding the cage as well as the S1 pedicle screws. The L5 pedicle screw does violate the anterior cortex of L5 but only abuts the iliac vessel. Patient appears to have a solid L3-4 L4-5 fusion with no residual stenosis.   A/P:  Gary Luna is  a very pleasant 58 year old gentleman with ongoing severe back pain. Based on his imaging studies I believe he has a pseudoarthrosis and symptomatic hardware. The patient's last surgery was in 2019 and he states he has been having progressive debilitating pain for the last 5 years. We discussed surgical and  nonsurgical treatment. He is expressed a desire to address the issue at L5-S1. Recommendations: 1. Removal of the posterior pedicle screw construct L3-S1. Since he has a solid fusion L3-4 and L4-5 I do not think replacement of the pedicle screws at this level is required. I would then retap the L5 and S1 screws and placed new screws at this level that are larger in diameter. Additional bone graft will be placed in the posterior lateral gutter. 2. After replacing the pedicle screws I would then close the wound and through an anterior approach remove the intervertebral cage at L5-S1 and perform a standard anterior lumbar interbody fusion with autograft. 3. Once the ALIF was completed I would then return to the posterior incision and placed rods to complete the posterior lateral arthrodesis and fusion at L5-S1. Implants: Globus titanium intervertebral cage. NuVasive pedicle screws. Graft: Autograft from iliac crest bone graft site. Will obtain bone graft from separate incision. If needed would supplement bone graft with DBX mix. I have explained the surgical procedure in great detail with the patient and he is expressed a desire to move forward with surgery. Risks and benefits of anterior lumbar fusion surgery were discussed with the patient. These include: Infection, bleeding, death, stroke, paralysis, ongoing or worse pain, need for additional surgery, nonunion, leak of spinal fluid, adjacent segment degeneration requiring additional fusion surgery, need for posterior decompression and/or fusion. Bleeding from major vessels, and blood clots (deep venous thrombosis)requiring additional treatment, loss in bowel and bladder control, injury to bladder, ureters, and other major abdominal organs. (risks for men also include retrograde ejaculation) Risks and benefits of posterior spinal fusion: Infection, bleeding, death, stroke, paralysis, ongoing or worse pain, need for additional surgery, nonunion, leak of  spinal fluid, adjacent segment degeneration requiring additional fusion surgery, Injury to abdominal vessels that can require anterior surgery to stop bleeding. Malposition of the cage and/or pedicle screws that could require additional surgery. Loss of bowel and bladder control. Postoperative hematoma causing neurologic compression that could require urgent or emergent re-operation.

## 2023-05-28 NOTE — Anesthesia Procedure Notes (Signed)
Procedure Name: Intubation Date/Time: 05/28/2023 7:42 AM  Performed by: Allyn Kenner, CRNAPre-anesthesia Checklist: Patient identified, Emergency Drugs available, Suction available and Patient being monitored Patient Re-evaluated:Patient Re-evaluated prior to induction Oxygen Delivery Method: Circle System Utilized Preoxygenation: Pre-oxygenation with 100% oxygen Induction Type: IV induction Ventilation: Mask ventilation without difficulty Laryngoscope Size: Mac and 4 Grade View: Grade I Tube type: Oral Tube size: 7.0 mm Number of attempts: 1 Airway Equipment and Method: Stylet and Oral airway Placement Confirmation: ETT inserted through vocal cords under direct vision, positive ETCO2 and breath sounds checked- equal and bilateral Secured at: 22 cm Tube secured with: Tape Dental Injury: Teeth and Oropharynx as per pre-operative assessment

## 2023-05-28 NOTE — Brief Op Note (Signed)
05/28/2023  8:48 AM  PATIENT:  [name]  58 y.o. male  PRE-OPERATIVE DIAGNOSIS:  Failed back syndrome with L5-S1 nonunion  POST-OPERATIVE DIAGNOSIS: Same PROCEDURE:  Procedure(s): CANCELLED PROCEDURE  SURGEON:  Surgeons and Role:    Venita Lick, MD - Primary  PHYSICIAN ASSISTANT:   ASSISTANTS: Luther Bradley  ANESTHESIA:   general  EBL:  none   BLOOD ADMINISTERED:none  DRAINS: none   LOCAL MEDICATIONS USED:  NONE  SPECIMEN:  No Specimen  DISPOSITION OF SPECIMEN:  N/A  COUNTS:  YES  TOURNIQUET:  * No tourniquets in log *  DICTATION: .Dragon Dictation  PLAN OF CARE: Discharge to home after PACU  PATIENT DISPOSITION:  PACU - hemodynamically stable.

## 2023-05-28 NOTE — Op Note (Signed)
OPERATIVE REPORT  DATE OF SURGERY: 05/28/2023  PATIENT NAME:  Gary Luna MRN: [id number] DOB: Feb 22, 1966  PCP: Soundra Pilon, FNP  PRE-OPERATIVE DIAGNOSIS: Pseudoarthrosis L5-S1.  Failed back syndrome with prior L3-S1 instrumented fusion  POST-OPERATIVE DIAGNOSIS: Same  PROCEDURE:   Procedure canceled due to contaminant  SURGEON:  Venita Lick, MD  PHYSICIAN ASSISTANT: Luther Bradley  ANESTHESIA:   General  EBL: None   Complications: After intubation and positioning on the surgical table it was noted that there was a contaminant.  Therefore the entire back table including all the implants were considered contaminated.  As a result the patient was extubated and transferred back to the PACU.   Venita Lick, MD 05/28/2023 8:46 AM

## 2023-05-29 MED FILL — Thrombin For Soln 20000 Unit: CUTANEOUS | Qty: 1 | Status: AC

## 2023-06-18 NOTE — Progress Notes (Signed)
 Surgical Instructions   Your procedure is scheduled on Monday, June 25, 2023.Marland Kitchen Report to [facility] Main Entrance "A" at 5:30 A.M., then check in with the Admitting office. Any questions or running late day of surgery: call [phone]  Questions prior to your surgery date: call [phone], Monday-Friday, 8am-4pm. If you experience any cold or flu symptoms such as cough, fever, chills, shortness of breath, etc. between now and your scheduled surgery, please notify us at the above number.     Remember:  Do not eat after midnight the night before your surgery  You may drink clear liquids until 4:30 the morning of your surgery.   Clear liquids allowed are: Water, Non-Citrus Juices (without pulp), Carbonated Beverages, Clear Tea (no milk, honey, etc.), Black Coffee Only (NO MILK, CREAM OR POWDERED CREAMER of any kind), and Gatorade.    Take these medicines the morning of surgery with A SIP OF WATER   amLODipine (NORVASC)    May take these medicines IF NEEDED: acetaminophen (TYLENOL)    One week prior to surgery, STOP taking any Aspirin (unless otherwise instructed by your surgeon) Aleve, Naproxen, Ibuprofen, Motrin, Advil, Goody's, BC's, all herbal medications, fish oil, and non-prescription vitamins.           WHAT DO I DO ABOUT MY DIABETES MEDICATION?   Do not take your METFORMIN morning of surgery.   Hold JARDIANCE for 72 hours prior to surgery. Last dose on 06-21-23.   HOW TO MANAGE YOUR DIABETES BEFORE AND AFTER SURGERY  Why is it important to control my blood sugar before and after surgery? Improving blood sugar levels before and after surgery helps healing and can limit problems. A way of improving blood sugar control is eating a healthy diet by:  Eating less sugar and carbohydrates  Increasing activity/exercise  Talking with your doctor about reaching your blood sugar goals High blood sugars (greater than 180 mg/dL) can raise your risk of infections and slow your  recovery, so you will need to focus on controlling your diabetes during the weeks before surgery. Make sure that the doctor who takes care of your diabetes knows about your planned surgery including the date and location.  How do I manage my blood sugar before surgery? Check your blood sugar at least 4 times a day, starting 2 days before surgery, to make sure that the level is not too high or low.  Check your blood sugar the morning of your surgery when you wake up and every 2 hours until you get to the Short Stay unit.  If your blood sugar is less than 70 mg/dL, you will need to treat for low blood sugar: Do not take insulin. Treat a low blood sugar (less than 70 mg/dL) with  cup of clear juice (cranberry or apple), 4 glucose tablets, OR glucose gel. Recheck blood sugar in 15 minutes after treatment (to make sure it is greater than 70 mg/dL). If your blood sugar is not greater than 70 mg/dL on recheck, call [phone] for further instructions. Report your blood sugar to the short stay nurse when you get to Short Stay.  If you are admitted to the hospital after surgery: Your blood sugar will be checked by the staff and you will probably be given insulin after surgery (instead of oral diabetes medicines) to make sure you have good blood sugar levels. The goal for blood sugar control after surgery is 80-180 mg/dL.           Do NOT Smoke (Tobacco/Vaping)  for 24 hours prior to your procedure.  If you use a CPAP at night, you may bring your mask/headgear for your overnight stay.   You will be asked to remove any contacts, glasses, piercing's, hearing aid's, dentures/partials prior to surgery. Please bring cases for these items if needed.    Patients discharged the day of surgery will not be allowed to drive home, and someone needs to stay with them for 24 hours.  SURGICAL WAITING ROOM VISITATION Patients may have no more than 2 support people in the waiting area - these visitors may rotate.    Pre-op nurse will coordinate an appropriate time for 1 ADULT support person, who may not rotate, to accompany patient in pre-op.  Children under the age of 44 must have an adult with them who is not the patient and must remain in the main waiting area with an adult.  If the patient needs to stay at the hospital during part of their recovery, the visitor guidelines for inpatient rooms apply.  Please refer to the [facility] website for the visitor guidelines for any additional information.   If you received a COVID test during your pre-op visit  it is requested that you wear a mask when out in public, stay away from anyone that may not be feeling well and notify your surgeon if you develop symptoms. If you have been in contact with anyone that has tested positive in the last 10 days please notify you surgeon.      Pre-operative 5 CHG Bathing Instructions   You can play a key role in reducing the risk of infection after surgery. Your skin needs to be as free of germs as possible. You can reduce the number of germs on your skin by washing with CHG (chlorhexidine gluconate) soap before surgery. CHG is an antiseptic soap that kills germs and continues to kill germs even after washing.   DO NOT use if you have an allergy to chlorhexidine/CHG or antibacterial soaps. If your skin becomes reddened or irritated, stop using the CHG and notify one of our RNs at [phone].   Please shower with the CHG soap starting 4 days before surgery using the following schedule:     Please keep in mind the following:  DO NOT shave, including legs and underarms, starting the day of your first shower.   You may shave your face at any point before/day of surgery.  Place clean sheets on your bed the day you start using CHG soap. Use a clean washcloth (not used since being washed) for each shower. DO NOT sleep with pets once you start using the CHG.   CHG Shower Instructions:  Wash your face and private area  with normal soap. If you choose to wash your hair, wash first with your normal shampoo.  After you use shampoo/soap, rinse your hair and body thoroughly to remove shampoo/soap residue.  Turn the water OFF and apply about 3 tablespoons (45 ml) of CHG soap to a CLEAN washcloth.  Apply CHG soap ONLY FROM YOUR NECK DOWN TO YOUR TOES (washing for 3-5 minutes)  DO NOT use CHG soap on face, private areas, open wounds, or sores.  Pay special attention to the area where your surgery is being performed.  If you are having back surgery, having someone wash your back for you may be helpful. Wait 2 minutes after CHG soap is applied, then you may rinse off the CHG soap.  Pat dry with a clean towel  Put  on clean clothes/pajamas   If you choose to wear lotion, please use ONLY the CHG-compatible lotions that are listed below.  Additional instructions for the day of surgery: DO NOT APPLY any lotions, deodorants, cologne, or perfumes.   Do not bring valuables to the hospital. [facility] is not responsible for any belongings/valuables. Do not wear nail polish, gel polish, artificial nails, or any other type of covering on natural nails (fingers and toes) Do not wear jewelry or makeup Put on clean/comfortable clothes.  Please brush your teeth.  Ask your nurse before applying any prescription medications to the skin.     CHG Compatible Lotions   Aveeno Moisturizing lotion  Cetaphil Moisturizing Cream  Cetaphil Moisturizing Lotion  Clairol Herbal Essence Moisturizing Lotion, Dry Skin  Clairol Herbal Essence Moisturizing Lotion, Extra Dry Skin  Clairol Herbal Essence Moisturizing Lotion, Normal Skin  Curel Age Defying Therapeutic Moisturizing Lotion with Alpha Hydroxy  Curel Extreme Care Body Lotion  Curel Soothing Hands Moisturizing Hand Lotion  Curel Therapeutic Moisturizing Cream, Fragrance-Free  Curel Therapeutic Moisturizing Lotion, Fragrance-Free  Curel Therapeutic Moisturizing Lotion, Original  Formula  Eucerin Daily Replenishing Lotion  Eucerin Dry Skin Therapy Plus Alpha Hydroxy Crme  Eucerin Dry Skin Therapy Plus Alpha Hydroxy Lotion  Eucerin Original Crme  Eucerin Original Lotion  Eucerin Plus Crme Eucerin Plus Lotion  Eucerin TriLipid Replenishing Lotion  Keri Anti-Bacterial Hand Lotion  Keri Deep Conditioning Original Lotion Dry Skin Formula Softly Scented  Keri Deep Conditioning Original Lotion, Fragrance Free Sensitive Skin Formula  Keri Lotion Fast Absorbing Fragrance Free Sensitive Skin Formula  Keri Lotion Fast Absorbing Softly Scented Dry Skin Formula  Keri Original Lotion  Keri Skin Renewal Lotion Keri Silky Smooth Lotion  Keri Silky Smooth Sensitive Skin Lotion  Nivea Body Creamy Conditioning Oil  Nivea Body Extra Enriched Lotion  Nivea Body Original Lotion  Nivea Body Sheer Moisturizing Lotion Nivea Crme  Nivea Skin Firming Lotion  NutraDerm 30 Skin Lotion  NutraDerm Skin Lotion  NutraDerm Therapeutic Skin Cream  NutraDerm Therapeutic Skin Lotion  ProShield Protective Hand Cream  Provon moisturizing lotion  Please read over the following fact sheets that you were given.

## 2023-06-18 NOTE — Progress Notes (Signed)
 Left message on voice mail requesting surgical orders from Dr. Shon Baton regarding upcoming surgery.

## 2023-06-19 ENCOUNTER — Other Ambulatory Visit: Payer: Self-pay

## 2023-06-19 ENCOUNTER — Encounter (HOSPITAL_COMMUNITY): Payer: Self-pay

## 2023-06-19 ENCOUNTER — Encounter (HOSPITAL_COMMUNITY)
Admission: RE | Admit: 2023-06-19 | Discharge: 2023-06-19 | Disposition: A | Discharge status: Home / Self Care | Payer: BC Managed Care – PPO | Source: Ambulatory Visit | Attending: Orthopedic Surgery

## 2023-06-19 VITALS — BP 165/90 | HR 96 | Temp 98.2°F | Resp 18 | Ht 69.0 in | Wt 184.7 lb

## 2023-06-19 DIAGNOSIS — Z01812 Encounter for preprocedural laboratory examination: Secondary | ICD-10-CM | POA: Insufficient documentation

## 2023-06-19 DIAGNOSIS — Z01818 Encounter for other preprocedural examination: Secondary | ICD-10-CM

## 2023-06-19 LAB — BASIC METABOLIC PANEL
Anion gap: 13 (ref 5–15)
BUN: 15 mg/dL (ref 6–20)
CO2: 23 mmol/L (ref 22–32)
Calcium: 9.4 mg/dL (ref 8.9–10.3)
Chloride: 98 mmol/L (ref 98–111)
Creatinine, Ser: 1.02 mg/dL (ref 0.61–1.24)
GFR, Estimated: >60 mL/min (ref >60)
Glucose, Bld: 160 mg/dL — ABNORMAL HIGH (ref 70–99)
Potassium: 3.7 mmol/L (ref 3.5–5.1)
Sodium: 134 mmol/L — ABNORMAL LOW (ref 135–145)

## 2023-06-19 LAB — CBC
HCT: 42.8 % (ref 39.0–52.0)
Hemoglobin: 14.9 g/dL (ref 13.0–17.0)
MCH: 31.3 pg (ref 26.0–34.0)
MCHC: 34.8 g/dL (ref 30.0–36.0)
MCV: 89.9 fL (ref 80.0–100.0)
Platelets: 301 10*3/uL (ref 150–400)
RBC: 4.76 MIL/uL (ref 4.22–5.81)
RDW: 13.1 % (ref 11.5–15.5)
WBC: 6 10*3/uL (ref 4.0–10.5)
nRBC: 0 % (ref 0.0–0.2)

## 2023-06-19 LAB — SURGICAL PCR SCREEN
MRSA, PCR: NEGATIVE
Staphylococcus aureus: NEGATIVE

## 2023-06-19 LAB — TYPE AND SCREEN
ABO/RH(D): A POS
Antibody Screen: NEGATIVE

## 2023-06-19 LAB — GLUCOSE, CAPILLARY: Glucose-Capillary: 176 mg/dL — ABNORMAL HIGH (ref 70–99)

## 2023-06-19 NOTE — Progress Notes (Signed)
 Patient was originally scheduled for surgery on February 3rd but was canceled after taken to the OR.   Patient reports not changes since that time and verbalizes understanding of new arrival time and date.

## 2023-06-20 ENCOUNTER — Ambulatory Visit (HOSPITAL_COMMUNITY): Payer: Self-pay | Admitting: Orthopedic Surgery

## 2023-06-25 ENCOUNTER — Inpatient Hospital Stay (HOSPITAL_COMMUNITY)

## 2023-06-25 ENCOUNTER — Encounter (HOSPITAL_COMMUNITY)
Admission: RE | Disposition: A | Discharge status: Home / Self Care | Payer: Self-pay | Source: Home / Self Care | Attending: Orthopedic Surgery

## 2023-06-25 ENCOUNTER — Inpatient Hospital Stay (HOSPITAL_COMMUNITY)
Admission: RE | Admit: 2023-06-25 | Discharge: 2023-06-28 | DRG: 402 | Disposition: A | Discharge status: Home / Self Care | Payer: BC Managed Care – PPO | Attending: Orthopedic Surgery | Admitting: Orthopedic Surgery

## 2023-06-25 ENCOUNTER — Other Ambulatory Visit: Payer: Self-pay

## 2023-06-25 ENCOUNTER — Inpatient Hospital Stay (HOSPITAL_COMMUNITY): Payer: Self-pay | Admitting: Anesthesiology

## 2023-06-25 ENCOUNTER — Encounter (HOSPITAL_COMMUNITY): Payer: Self-pay | Admitting: Orthopedic Surgery

## 2023-06-25 DIAGNOSIS — Y848 Other medical procedures as the cause of abnormal reaction of the patient, or of later complication, without mention of misadventure at the time of the procedure: Secondary | ICD-10-CM | POA: Diagnosis present

## 2023-06-25 DIAGNOSIS — Z79899 Other long term (current) drug therapy: Secondary | ICD-10-CM

## 2023-06-25 DIAGNOSIS — G8929 Other chronic pain: Secondary | ICD-10-CM | POA: Diagnosis not present

## 2023-06-25 DIAGNOSIS — Z7901 Long term (current) use of anticoagulants: Secondary | ICD-10-CM

## 2023-06-25 DIAGNOSIS — Z888 Allergy status to other drugs, medicaments and biological substances status: Secondary | ICD-10-CM | POA: Diagnosis not present

## 2023-06-25 DIAGNOSIS — Z01818 Encounter for other preprocedural examination: Secondary | ICD-10-CM

## 2023-06-25 DIAGNOSIS — I10 Essential (primary) hypertension: Secondary | ICD-10-CM | POA: Diagnosis present

## 2023-06-25 DIAGNOSIS — M960 Pseudarthrosis after fusion or arthrodesis: Secondary | ICD-10-CM | POA: Diagnosis present

## 2023-06-25 DIAGNOSIS — Z7984 Long term (current) use of oral hypoglycemic drugs: Secondary | ICD-10-CM

## 2023-06-25 DIAGNOSIS — E119 Type 2 diabetes mellitus without complications: Secondary | ICD-10-CM | POA: Diagnosis not present

## 2023-06-25 DIAGNOSIS — M48062 Spinal stenosis, lumbar region with neurogenic claudication: Secondary | ICD-10-CM | POA: Diagnosis not present

## 2023-06-25 DIAGNOSIS — M545 Low back pain, unspecified: Secondary | ICD-10-CM

## 2023-06-25 DIAGNOSIS — S32009K Unspecified fracture of unspecified lumbar vertebra, subsequent encounter for fracture with nonunion: Secondary | ICD-10-CM | POA: Diagnosis not present

## 2023-06-25 DIAGNOSIS — G8918 Other acute postprocedural pain: Secondary | ICD-10-CM | POA: Diagnosis not present

## 2023-06-25 DIAGNOSIS — M48061 Spinal stenosis, lumbar region without neurogenic claudication: Secondary | ICD-10-CM | POA: Diagnosis not present

## 2023-06-25 DIAGNOSIS — I82409 Acute embolism and thrombosis of unspecified deep veins of unspecified lower extremity: Secondary | ICD-10-CM | POA: Diagnosis not present

## 2023-06-25 DIAGNOSIS — T84226A Displacement of internal fixation device of vertebrae, initial encounter: Secondary | ICD-10-CM | POA: Diagnosis not present

## 2023-06-25 DIAGNOSIS — Z981 Arthrodesis status: Secondary | ICD-10-CM | POA: Diagnosis not present

## 2023-06-25 HISTORY — PX: REMOVAL POSTERIOR SPINAL INSTRUMENTATION: SHX7452

## 2023-06-25 HISTORY — PX: HARVEST BONE GRAFT: SHX377

## 2023-06-25 HISTORY — PX: ANTERIOR AND POSTERIOR SPINAL FUSION: SHX2259

## 2023-06-25 HISTORY — PX: ABDOMINAL EXPOSURE: SHX5708

## 2023-06-25 LAB — CBC
HCT: 40.9 % (ref 39.0–52.0)
Hemoglobin: 13.6 g/dL (ref 13.0–17.0)
MCH: 31.2 pg (ref 26.0–34.0)
MCHC: 33.3 g/dL (ref 30.0–36.0)
MCV: 93.8 fL (ref 80.0–100.0)
Platelets: 219 10*3/uL (ref 150–400)
RBC: 4.36 MIL/uL (ref 4.22–5.81)
RDW: 13.1 % (ref 11.5–15.5)
WBC: 10.6 10*3/uL — ABNORMAL HIGH (ref 4.0–10.5)
nRBC: 0 % (ref 0.0–0.2)

## 2023-06-25 LAB — GLUCOSE, CAPILLARY
Glucose-Capillary: 147 mg/dL — ABNORMAL HIGH (ref 70–99)
Glucose-Capillary: 130 mg/dL — ABNORMAL HIGH (ref 70–99)
Glucose-Capillary: 213 mg/dL — ABNORMAL HIGH (ref 70–99)
Glucose-Capillary: 188 mg/dL — ABNORMAL HIGH (ref 70–99)
Glucose-Capillary: 162 mg/dL — ABNORMAL HIGH (ref 70–99)
Glucose-Capillary: 120 mg/dL — ABNORMAL HIGH (ref 70–99)
Glucose-Capillary: 166 mg/dL — ABNORMAL HIGH (ref 70–99)

## 2023-06-25 LAB — CREATININE, SERUM
Creatinine, Ser: 0.95 mg/dL (ref 0.61–1.24)
GFR, Estimated: >60 mL/min (ref >60)

## 2023-06-25 SURGERY — REMOVAL POSTERIOR SPINAL INSTRUMENTATION
Anesthesia: General | Site: Spine Lumbar

## 2023-06-25 MED ORDER — BUPIVACAINE-EPINEPHRINE 0.25% -1:200000 IJ SOLN
INTRAMUSCULAR | Status: DC | PRN
  Administered 2023-06-25: 10 mL
  Administered 2023-06-25: 8 mL
  Administered 2023-06-25: 10 mL

## 2023-06-25 MED ORDER — PROPOFOL 10 MG/ML IV BOLUS
INTRAVENOUS | Status: AC
  Filled 2023-06-25: qty 20

## 2023-06-25 MED ORDER — BUPIVACAINE LIPOSOME 1.3 % IJ SUSP
INTRAMUSCULAR | Status: AC
  Filled 2023-06-25: qty 20

## 2023-06-25 MED ORDER — MENTHOL 3 MG MT LOZG: 1.0000 | LOZENGE | OROMUCOSAL | Status: DC | PRN

## 2023-06-25 MED ORDER — BENAZEPRIL HCL 20 MG PO TABS
20.0000 mg | ORAL_TABLET | Freq: Every day | ORAL | Status: DC
  Administered 2023-06-25 – 2023-06-28 (×4): 20 mg via ORAL
  Filled 2023-06-25 (×4): qty 1

## 2023-06-25 MED ORDER — PHENOL 1.4 % MT LIQD: 1.0000 | OROMUCOSAL | Status: DC | PRN

## 2023-06-25 MED ORDER — METHOCARBAMOL 500 MG PO TABS: 500.0000 mg | ORAL_TABLET | Freq: Three times a day (TID) | ORAL | 0 refills | Status: AC | PRN

## 2023-06-25 MED ORDER — HYDROMORPHONE HCL 1 MG/ML IJ SOLN: 1.0000 mg | INTRAMUSCULAR | Status: AC | PRN

## 2023-06-25 MED ORDER — SODIUM CHLORIDE 0.9 % IV SOLN: 250.0000 mL | INTRAVENOUS | Status: AC

## 2023-06-25 MED ORDER — KETAMINE HCL 50 MG/5ML IJ SOSY
PREFILLED_SYRINGE | INTRAMUSCULAR | Status: AC
  Filled 2023-06-25: qty 5

## 2023-06-25 MED ORDER — PHENYLEPHRINE HCL-NACL 20-0.9 MG/250ML-% IV SOLN
INTRAVENOUS | Status: DC | PRN
  Administered 2023-06-25: 30 ug/min via INTRAVENOUS

## 2023-06-25 MED ORDER — 0.9 % SODIUM CHLORIDE (POUR BTL) OPTIME
TOPICAL | Status: DC | PRN
  Administered 2023-06-25 (×3): 1000 mL

## 2023-06-25 MED ORDER — SODIUM CHLORIDE 0.9% FLUSH
3.0000 mL | Freq: Two times a day (BID) | INTRAVENOUS | Status: DC
  Administered 2023-06-26: 3 mL via INTRAVENOUS

## 2023-06-25 MED ORDER — VANCOMYCIN HCL 1000 MG IV SOLR
INTRAVENOUS | Status: AC
  Filled 2023-06-25: qty 20

## 2023-06-25 MED ORDER — LACTATED RINGERS IV SOLN: INTRAVENOUS | Status: DC

## 2023-06-25 MED ORDER — METHOCARBAMOL 500 MG PO TABS
500.0000 mg | ORAL_TABLET | Freq: Four times a day (QID) | ORAL | Status: DC | PRN
  Administered 2023-06-25 – 2023-06-28 (×10): 500 mg via ORAL
  Filled 2023-06-25 (×10): qty 1

## 2023-06-25 MED ORDER — INSULIN ASPART 100 UNIT/ML IJ SOLN
0.0000 [IU] | Freq: Three times a day (TID) | INTRAMUSCULAR | Status: DC
  Administered 2023-06-26: 2 [IU] via SUBCUTANEOUS
  Administered 2023-06-26 – 2023-06-27 (×2): 3 [IU] via SUBCUTANEOUS

## 2023-06-25 MED ORDER — BENAZEPRIL-HYDROCHLOROTHIAZIDE 20-25 MG PO TABS: 1.0000 | ORAL_TABLET | Freq: Every day | ORAL | Status: DC

## 2023-06-25 MED ORDER — HYDROCHLOROTHIAZIDE 25 MG PO TABS
25.0000 mg | ORAL_TABLET | Freq: Every day | ORAL | Status: DC
  Administered 2023-06-25 – 2023-06-28 (×4): 25 mg via ORAL
  Filled 2023-06-25 (×4): qty 1

## 2023-06-25 MED ORDER — BUPIVACAINE LIPOSOME 1.3 % IJ SUSP
INTRAMUSCULAR | Status: AC
Start: 2023-06-25 — End: 2023-06-25
  Filled 2023-06-25: qty 10

## 2023-06-25 MED ORDER — THROMBIN 20000 UNITS EX SOLR
CUTANEOUS | Status: AC
  Filled 2023-06-25: qty 20000

## 2023-06-25 MED ORDER — EMPAGLIFLOZIN 25 MG PO TABS
25.0000 mg | ORAL_TABLET | Freq: Every day | ORAL | Status: DC
  Administered 2023-06-25 – 2023-06-28 (×4): 25 mg via ORAL
  Filled 2023-06-25 (×4): qty 1

## 2023-06-25 MED ORDER — ONDANSETRON HCL 4 MG PO TABS: 4.0000 mg | ORAL_TABLET | Freq: Four times a day (QID) | ORAL | Status: DC | PRN

## 2023-06-25 MED ORDER — CHLORHEXIDINE GLUCONATE CLOTH 2 % EX PADS: 6.0000 | MEDICATED_PAD | Freq: Once | CUTANEOUS | Status: DC

## 2023-06-25 MED ORDER — ACETAMINOPHEN 325 MG PO TABS: 650.0000 mg | ORAL_TABLET | ORAL | Status: DC | PRN

## 2023-06-25 MED ORDER — THROMBIN 20000 UNITS EX SOLR
CUTANEOUS | Status: DC | PRN
  Administered 2023-06-25: 20 mL

## 2023-06-25 MED ORDER — ENOXAPARIN SODIUM 40 MG/0.4ML IJ SOSY: 40.0000 mg | PREFILLED_SYRINGE | INTRAMUSCULAR | 0 refills | Status: AC

## 2023-06-25 MED ORDER — SODIUM CHLORIDE 0.9 % IV SOLN: INTRAVENOUS | Status: DC | PRN

## 2023-06-25 MED ORDER — KETAMINE HCL 50 MG/5ML IJ SOSY
PREFILLED_SYRINGE | INTRAMUSCULAR | Status: DC | PRN
Start: 2023-06-25 — End: 2023-06-25
  Administered 2023-06-25: 10 mg via INTRAVENOUS
  Administered 2023-06-25: 25 mg via INTRAVENOUS
  Administered 2023-06-25: 10 mg via INTRAVENOUS
  Administered 2023-06-25: 15 mg via INTRAVENOUS
  Administered 2023-06-25: 10 mg via INTRAVENOUS

## 2023-06-25 MED ORDER — DEXAMETHASONE SODIUM PHOSPHATE 10 MG/ML IJ SOLN
INTRAMUSCULAR | Status: AC
  Filled 2023-06-25: qty 1

## 2023-06-25 MED ORDER — ORAL CARE MOUTH RINSE: 15.0000 mL | Freq: Once | OROMUCOSAL | Status: AC

## 2023-06-25 MED ORDER — ONDANSETRON HCL 4 MG/2ML IJ SOLN
INTRAMUSCULAR | Status: DC | PRN
  Administered 2023-06-25: 4 mg via INTRAVENOUS

## 2023-06-25 MED ORDER — ROCURONIUM BROMIDE 10 MG/ML (PF) SYRINGE
PREFILLED_SYRINGE | INTRAVENOUS | Status: DC | PRN
  Administered 2023-06-25: 10 mg via INTRAVENOUS
  Administered 2023-06-25 (×2): 20 mg via INTRAVENOUS
  Administered 2023-06-25: 60 mg via INTRAVENOUS
  Administered 2023-06-25: 40 mg via INTRAVENOUS
  Administered 2023-06-25: 20 mg via INTRAVENOUS
  Administered 2023-06-25: 10 mg via INTRAVENOUS
  Administered 2023-06-25 (×2): 20 mg via INTRAVENOUS

## 2023-06-25 MED ORDER — HEMOSTATIC AGENTS (NO CHARGE) OPTIME
TOPICAL | Status: DC | PRN
  Administered 2023-06-25 (×2): 1

## 2023-06-25 MED ORDER — DEXAMETHASONE SODIUM PHOSPHATE 10 MG/ML IJ SOLN
INTRAMUSCULAR | Status: DC | PRN
  Administered 2023-06-25: 10 mg via INTRAVENOUS

## 2023-06-25 MED ORDER — SUGAMMADEX SODIUM 200 MG/2ML IV SOLN
INTRAVENOUS | Status: DC | PRN
Start: 2023-06-25 — End: 2023-06-25
  Administered 2023-06-25: 200 mg via INTRAVENOUS

## 2023-06-25 MED ORDER — ACETAMINOPHEN 650 MG RE SUPP: 650.0000 mg | RECTAL | Status: DC | PRN

## 2023-06-25 MED ORDER — KETAMINE HCL 50 MG/5ML IJ SOSY
PREFILLED_SYRINGE | INTRAMUSCULAR | Status: AC
Start: 2023-06-25 — End: ?
  Filled 2023-06-25: qty 5

## 2023-06-25 MED ORDER — AMLODIPINE BESYLATE 10 MG PO TABS
10.0000 mg | ORAL_TABLET | Freq: Every day | ORAL | Status: DC
  Administered 2023-06-26 – 2023-06-28 (×3): 10 mg via ORAL
  Filled 2023-06-25 (×3): qty 1

## 2023-06-25 MED ORDER — ACETAMINOPHEN 10 MG/ML IV SOLN
INTRAVENOUS | Status: DC | PRN
  Administered 2023-06-25: 1000 mg via INTRAVENOUS

## 2023-06-25 MED ORDER — PROPOFOL 10 MG/ML IV BOLUS
INTRAVENOUS | Status: DC | PRN
  Administered 2023-06-25: 200 mg via INTRAVENOUS
  Administered 2023-06-25: 50 mg via INTRAVENOUS

## 2023-06-25 MED ORDER — HYDROMORPHONE HCL 1 MG/ML IJ SOLN
INTRAMUSCULAR | Status: DC | PRN
  Administered 2023-06-25 (×2): .5 mg via INTRAVENOUS

## 2023-06-25 MED ORDER — DEXMEDETOMIDINE HCL IN NACL 80 MCG/20ML IV SOLN
INTRAVENOUS | Status: DC | PRN
  Administered 2023-06-25 (×5): 4 ug via INTRAVENOUS

## 2023-06-25 MED ORDER — HYDROMORPHONE HCL 1 MG/ML IJ SOLN
INTRAMUSCULAR | Status: AC
  Filled 2023-06-25: qty 0.5

## 2023-06-25 MED ORDER — LIDOCAINE 2% (20 MG/ML) 5 ML SYRINGE
INTRAMUSCULAR | Status: AC
  Filled 2023-06-25: qty 5

## 2023-06-25 MED ORDER — AMISULPRIDE (ANTIEMETIC) 5 MG/2ML IV SOLN: 10.0000 mg | Freq: Once | INTRAVENOUS | Status: DC | PRN

## 2023-06-25 MED ORDER — METHOCARBAMOL 1000 MG/10ML IJ SOLN: 500.0000 mg | Freq: Four times a day (QID) | INTRAMUSCULAR | Status: DC | PRN

## 2023-06-25 MED ORDER — TRANEXAMIC ACID 1000 MG/10ML IV SOLN
2000.0000 mg | Freq: Once | INTRAVENOUS | Status: AC
  Administered 2023-06-25: 2000 mg via TOPICAL
  Filled 2023-06-25: qty 20

## 2023-06-25 MED ORDER — INSULIN ASPART 100 UNIT/ML IJ SOLN
0.0000 [IU] | INTRAMUSCULAR | Status: AC | PRN
  Administered 2023-06-25: 4 [IU] via SUBCUTANEOUS
  Administered 2023-06-25 (×2): 2 [IU] via SUBCUTANEOUS
  Administered 2023-06-25: 4 [IU] via SUBCUTANEOUS
  Filled 2023-06-25: qty 1

## 2023-06-25 MED ORDER — OXYCODONE-ACETAMINOPHEN 10-325 MG PO TABS: 1.0000 | ORAL_TABLET | Freq: Four times a day (QID) | ORAL | 0 refills | Status: AC | PRN

## 2023-06-25 MED ORDER — FENTANYL CITRATE (PF) 250 MCG/5ML IJ SOLN
INTRAMUSCULAR | Status: AC
  Filled 2023-06-25: qty 5

## 2023-06-25 MED ORDER — MIDAZOLAM HCL 2 MG/2ML IJ SOLN
INTRAMUSCULAR | Status: DC | PRN
  Administered 2023-06-25: 2 mg via INTRAVENOUS

## 2023-06-25 MED ORDER — VANCOMYCIN HCL 1000 MG IV SOLR
INTRAVENOUS | Status: DC | PRN
  Administered 2023-06-25: 1000 mg

## 2023-06-25 MED ORDER — MAGNESIUM CITRATE PO SOLN
1.0000 | Freq: Once | ORAL | Status: AC | PRN
  Administered 2023-06-26: 1 via ORAL
  Filled 2023-06-25: qty 296

## 2023-06-25 MED ORDER — ONDANSETRON HCL 4 MG PO TABS: 4.0000 mg | ORAL_TABLET | Freq: Three times a day (TID) | ORAL | 0 refills | Status: AC | PRN

## 2023-06-25 MED ORDER — LIDOCAINE 2% (20 MG/ML) 5 ML SYRINGE
INTRAMUSCULAR | Status: DC | PRN
  Administered 2023-06-25: 60 mg via INTRAVENOUS

## 2023-06-25 MED ORDER — INSULIN ASPART 100 UNIT/ML IJ SOLN: 0.0000 [IU] | Freq: Every day | INTRAMUSCULAR | Status: DC

## 2023-06-25 MED ORDER — CHLORHEXIDINE GLUCONATE 0.12 % MT SOLN
15.0000 mL | Freq: Once | OROMUCOSAL | Status: AC
  Administered 2023-06-25: 15 mL via OROMUCOSAL
  Filled 2023-06-25: qty 15

## 2023-06-25 MED ORDER — SUGAMMADEX SODIUM 200 MG/2ML IV SOLN
INTRAVENOUS | Status: AC
  Filled 2023-06-25: qty 2

## 2023-06-25 MED ORDER — HYDROMORPHONE HCL 1 MG/ML IJ SOLN: 0.2500 mg | INTRAMUSCULAR | Status: DC | PRN

## 2023-06-25 MED ORDER — POLYETHYLENE GLYCOL 3350 17 G PO PACK: 17.0000 g | PACK | Freq: Every day | ORAL | Status: DC | PRN

## 2023-06-25 MED ORDER — ENOXAPARIN SODIUM 40 MG/0.4ML IJ SOSY
40.0000 mg | PREFILLED_SYRINGE | INTRAMUSCULAR | Status: DC
  Administered 2023-06-27 – 2023-06-28 (×2): 40 mg via SUBCUTANEOUS
  Filled 2023-06-25 (×2): qty 0.4

## 2023-06-25 MED ORDER — ACETAMINOPHEN 500 MG PO TABS
1000.0000 mg | ORAL_TABLET | Freq: Once | ORAL | Status: AC
  Administered 2023-06-25: 1000 mg via ORAL
  Filled 2023-06-25: qty 2

## 2023-06-25 MED ORDER — DEXMEDETOMIDINE HCL IN NACL 80 MCG/20ML IV SOLN
INTRAVENOUS | Status: AC
  Filled 2023-06-25: qty 20

## 2023-06-25 MED ORDER — ROCURONIUM BROMIDE 10 MG/ML (PF) SYRINGE
PREFILLED_SYRINGE | INTRAVENOUS | Status: AC
  Filled 2023-06-25: qty 10

## 2023-06-25 MED ORDER — OXYCODONE HCL 5 MG PO TABS: 5.0000 mg | ORAL_TABLET | ORAL | Status: DC | PRN

## 2023-06-25 MED ORDER — BUPIVACAINE-EPINEPHRINE (PF) 0.25% -1:200000 IJ SOLN
INTRAMUSCULAR | Status: AC
  Filled 2023-06-25: qty 30

## 2023-06-25 MED ORDER — SODIUM CHLORIDE 0.9% FLUSH: 3.0000 mL | INTRAVENOUS | Status: DC | PRN

## 2023-06-25 MED ORDER — CEFAZOLIN SODIUM-DEXTROSE 2-4 GM/100ML-% IV SOLN
2.0000 g | INTRAVENOUS | Status: AC
  Administered 2023-06-25 (×2): 2 g via INTRAVENOUS
  Filled 2023-06-25: qty 100

## 2023-06-25 MED ORDER — SURGIFLO WITH THROMBIN (HEMOSTATIC MATRIX KIT) OPTIME
TOPICAL | Status: DC | PRN
  Administered 2023-06-25 (×4): 1

## 2023-06-25 MED ORDER — ONDANSETRON HCL 4 MG/2ML IJ SOLN: 4.0000 mg | Freq: Four times a day (QID) | INTRAMUSCULAR | Status: DC | PRN

## 2023-06-25 MED ORDER — CHLORHEXIDINE GLUCONATE CLOTH 2 % EX PADS
6.0000 | MEDICATED_PAD | Freq: Once | CUTANEOUS | Status: DC
Start: 2023-06-25 — End: 2023-06-25

## 2023-06-25 MED ORDER — ONDANSETRON HCL 4 MG/2ML IJ SOLN
INTRAMUSCULAR | Status: AC
  Filled 2023-06-25: qty 2

## 2023-06-25 MED ORDER — METFORMIN HCL 500 MG PO TABS
2000.0000 mg | ORAL_TABLET | Freq: Every day | ORAL | Status: DC
  Administered 2023-06-26 – 2023-06-28 (×3): 2000 mg via ORAL
  Filled 2023-06-25 (×3): qty 4

## 2023-06-25 MED ORDER — OXYCODONE HCL 5 MG PO TABS
10.0000 mg | ORAL_TABLET | ORAL | Status: DC | PRN
  Administered 2023-06-25 – 2023-06-28 (×19): 10 mg via ORAL
  Filled 2023-06-25 (×19): qty 2

## 2023-06-25 MED ORDER — TRANEXAMIC ACID-NACL 1000-0.7 MG/100ML-% IV SOLN
1000.0000 mg | INTRAVENOUS | Status: AC
  Administered 2023-06-25: 1000 mg via INTRAVENOUS
  Filled 2023-06-25: qty 100

## 2023-06-25 MED ORDER — FENTANYL CITRATE (PF) 250 MCG/5ML IJ SOLN
INTRAMUSCULAR | Status: DC | PRN
Start: 2023-06-25 — End: 2023-06-25
  Administered 2023-06-25: 50 ug via INTRAVENOUS
  Administered 2023-06-25: 100 ug via INTRAVENOUS
  Administered 2023-06-25 (×2): 50 ug via INTRAVENOUS

## 2023-06-25 MED ORDER — MIDAZOLAM HCL 2 MG/2ML IJ SOLN
INTRAMUSCULAR | Status: AC
  Filled 2023-06-25: qty 2

## 2023-06-25 MED ORDER — SENNOSIDES-DOCUSATE SODIUM 8.6-50 MG PO TABS
1.0000 | ORAL_TABLET | Freq: Two times a day (BID) | ORAL | Status: DC
  Administered 2023-06-25 – 2023-06-28 (×6): 1 via ORAL
  Filled 2023-06-25 (×6): qty 1

## 2023-06-25 MED ORDER — BUPIVACAINE LIPOSOME 1.3 % IJ SUSP
INTRAMUSCULAR | Status: DC | PRN
  Administered 2023-06-25: 10 mL

## 2023-06-25 MED ORDER — BUPIVACAINE HCL (PF) 0.25 % IJ SOLN
INTRAMUSCULAR | Status: DC | PRN
  Administered 2023-06-25: 20 mL

## 2023-06-25 MED ORDER — CEFAZOLIN SODIUM-DEXTROSE 1-4 GM/50ML-% IV SOLN
1.0000 g | Freq: Three times a day (TID) | INTRAVENOUS | Status: AC
  Administered 2023-06-25 – 2023-06-26 (×2): 1 g via INTRAVENOUS
  Filled 2023-06-25 (×2): qty 50

## 2023-06-25 SURGICAL SUPPLY — 106 items
ANCHOR LUMBAR 25 MIS (Anchor) IMPLANT
APPLIER CLIP 11 MED OPEN (CLIP) ×3 IMPLANT
BAG COUNTER SPONGE SURGICOUNT (BAG) ×6 IMPLANT
BLADE CLIPPER SURG (BLADE) IMPLANT
BLADE SURG 10 STRL SS (BLADE) ×6 IMPLANT
BUR EGG ELITE 5.0 (BURR) IMPLANT
CLIP APPLIE 11 MED OPEN (CLIP) ×6 IMPLANT
CLIP LIGATING EXTRA MED SLVR (CLIP) IMPLANT
CLSR STERI-STRIP ANTIMIC 1/2X4 (GAUZE/BANDAGES/DRESSINGS) IMPLANT
CNTNR URN SCR LID CUP LEK RST (MISCELLANEOUS) IMPLANT
CORD BIPOLAR FORCEPS 12FT (ELECTRODE) ×3 IMPLANT
COUNTER NDL 20CT MAGNET RED (NEEDLE) IMPLANT
COVER SURGICAL LIGHT HANDLE (MISCELLANEOUS) ×6 IMPLANT
DRAIN CHANNEL 15F RND FF W/TCR (WOUND CARE) IMPLANT
DRAPE C-ARM 42X72 X-RAY (DRAPES) ×6 IMPLANT
DRAPE C-ARMOR (DRAPES) ×6 IMPLANT
DRAPE INCISE IOBAN 66X45 STRL (DRAPES) ×3 IMPLANT
DRAPE LAPAROTOMY T 102X78X121 (DRAPES) ×3 IMPLANT
DRAPE POUCH INSTRU U-SHP 10X18 (DRAPES) IMPLANT
DRAPE SURG 17X23 STRL (DRAPES) ×6 IMPLANT
DRAPE U-SHAPE 47X51 STRL (DRAPES) ×6 IMPLANT
DRSG AQUACEL AG ADV 3.5X 6 (GAUZE/BANDAGES/DRESSINGS) ×6 IMPLANT
DRSG OPSITE POSTOP 4X12 (GAUZE/BANDAGES/DRESSINGS) IMPLANT
DRSG OPSITE POSTOP 4X8 (GAUZE/BANDAGES/DRESSINGS) IMPLANT
DURAPREP 26ML APPLICATOR (WOUND CARE) ×6 IMPLANT
ELECT BLADE 4.0 EZ CLEAN MEGAD (MISCELLANEOUS) ×9 IMPLANT
ELECT CAUTERY BLADE 6.4 (BLADE) ×3 IMPLANT
ELECT NVM5 SURFACE MEP/EMG (ELECTRODE) IMPLANT
ELECT PENCIL ROCKER SW 15FT (MISCELLANEOUS) ×6 IMPLANT
ELECT REM PT RETURN 9FT ADLT (ELECTROSURGICAL) ×6 IMPLANT
ELECTRODE BLDE 4.0 EZ CLN MEGD (MISCELLANEOUS) ×6 IMPLANT
ELECTRODE REM PT RTRN 9FT ADLT (ELECTROSURGICAL) ×6 IMPLANT
GAUZE PAD ABD 8X10 STRL (GAUZE/BANDAGES/DRESSINGS) IMPLANT
GAUZE SPONGE 4X4 12PLY STRL (GAUZE/BANDAGES/DRESSINGS) IMPLANT
GLOVE BIO SURGEON STRL SZ 6.5 (GLOVE) ×6 IMPLANT
GLOVE BIO SURGEON STRL SZ7.5 (GLOVE) ×3 IMPLANT
GLOVE BIOGEL PI IND STRL 6.5 (GLOVE) ×6 IMPLANT
GLOVE BIOGEL PI IND STRL 8 (GLOVE) ×3 IMPLANT
GLOVE BIOGEL PI IND STRL 8.5 (GLOVE) ×6 IMPLANT
GLOVE OPTIFIT SS 7.5 STRL LX (GLOVE) ×3 IMPLANT
GLOVE SS BIOGEL STRL SZ 8.5 (GLOVE) ×6 IMPLANT
GOWN STRL REUS W/ TWL LRG LVL3 (GOWN DISPOSABLE) ×6 IMPLANT
GOWN STRL REUS W/ TWL XL LVL3 (GOWN DISPOSABLE) ×3 IMPLANT
GOWN STRL REUS W/TWL 2XL LVL3 (GOWN DISPOSABLE) ×6 IMPLANT
INSERT FOGARTY 61MM (MISCELLANEOUS) IMPLANT
INSERT FOGARTY SM (MISCELLANEOUS) IMPLANT
KIT BASIN OR (CUSTOM PROCEDURE TRAY) ×3 IMPLANT
KIT HARVESTER BONE 10 DISP (KITS) IMPLANT
KIT POSITION SURG JACKSON T1 (MISCELLANEOUS) ×3 IMPLANT
KIT TURNOVER KIT B (KITS) ×3 IMPLANT
MARKER PEN SURG W/LABELS BLK (STERILIZATION PRODUCTS) ×3 IMPLANT
MIX DBX 10CC 35% BONE (Bone Implant) IMPLANT
MODULE EMG NDL SSEP NVM5 (NEUROSURGERY SUPPLIES) IMPLANT
MODULE EMG NEEDLE SSEP NVM5 (NEUROSURGERY SUPPLIES) ×3 IMPLANT
NDL HYPO 22X1.5 SAFETY MO (MISCELLANEOUS) ×3 IMPLANT
NDL HYPO 25GX1X1/2 BEV (NEEDLE) IMPLANT
NDL SPNL 18GX3.5 QUINCKE PK (NEEDLE) IMPLANT
NEEDLE HYPO 22X1.5 SAFETY MO (MISCELLANEOUS) ×3 IMPLANT
NEEDLE HYPO 25GX1X1/2 BEV (NEEDLE) ×3 IMPLANT
NEEDLE SPNL 18GX3.5 QUINCKE PK (NEEDLE) ×3 IMPLANT
NS IRRIG 1000ML POUR BTL (IV SOLUTION) ×3 IMPLANT
PACK LAMINECTOMY ORTHO (CUSTOM PROCEDURE TRAY) ×3 IMPLANT
PACK UNIVERSAL I (CUSTOM PROCEDURE TRAY) ×6 IMPLANT
PAD ARMBOARD 7.5X6 YLW CONV (MISCELLANEOUS) ×12 IMPLANT
PATTIES SURGICAL .5 X1 (DISPOSABLE) IMPLANT
PROBE BALL TIP NVM5 SNG USE (NEUROSURGERY SUPPLIES) IMPLANT
PUTTY DBX 1CC (Putty) ×3 IMPLANT
PUTTY DBX 1CC DEPUY (Putty) IMPLANT
REDUCTION EXT RELINE MAS MOD (Neuro Prosthesis/Implant) IMPLANT
ROD RELINE MAS TI LORD 5.5X40 (Rod) IMPLANT
ROD SPINAL THRD DISP (MISCELLANEOUS) IMPLANT
SCREW LOCK RELINE 5.5 TULIP (Screw) IMPLANT
SCREW RELINE O POLY 8.5X50MM (Screw) IMPLANT
SCREW SHANK RELINE O 8.5X45 (Screw) IMPLANT
SPACER HEDRON IA 29X39 13 15D (Spacer) IMPLANT
SPONGE INTESTINAL PEANUT (DISPOSABLE) ×12 IMPLANT
SPONGE SURGIFOAM ABS GEL 100 (HEMOSTASIS) ×3 IMPLANT
SPONGE T-LAP 18X18 ~~LOC~~+RFID (SPONGE) ×3 IMPLANT
SPONGE T-LAP 4X18 ~~LOC~~+RFID (SPONGE) IMPLANT
STAPLER SKIN PROX 35W (STAPLE) IMPLANT
STRIP CLOSURE SKIN 1/2X4 (GAUZE/BANDAGES/DRESSINGS) ×6 IMPLANT
SURGIFLO W/THROMBIN 8M KIT (HEMOSTASIS) IMPLANT
SUT MNCRL AB 3-0 PS2 27 (SUTURE) ×6 IMPLANT
SUT PDS AB 1 CTX 36 (SUTURE) ×6 IMPLANT
SUT PROLENE 4-0 RB1 .5 CRCL 36 (SUTURE) IMPLANT
SUT PROLENE 5 0 CC1 (SUTURE) IMPLANT
SUT PROLENE 6 0 C 1 30 (SUTURE) IMPLANT
SUT PROLENE 6 0 CC (SUTURE) IMPLANT
SUT SILK 0 TIES 10X30 (SUTURE) IMPLANT
SUT SILK 2 0 TIES 10X30 (SUTURE) ×6 IMPLANT
SUT SILK 2 0SH CR/8 30 (SUTURE) IMPLANT
SUT SILK 3 0 TIES 10X30 (SUTURE) ×3 IMPLANT
SUT SILK 3 0SH CR/8 30 (SUTURE) IMPLANT
SUT SILK 3-0 18XBRD TIE BLK (SUTURE) IMPLANT
SUT STRATAFIX 1PDS 45CM VIOLET (SUTURE) IMPLANT
SUT VIC AB 1 CT1 18XCR BRD 8 (SUTURE) IMPLANT
SUT VIC AB 2-0 CT1 18 (SUTURE) ×6 IMPLANT
SYR 3ML LL SCALE MARK (SYRINGE) IMPLANT
SYR BULB IRRIG 60ML STRL (SYRINGE) ×3 IMPLANT
SYR CONTROL 10ML LL (SYRINGE) ×3 IMPLANT
TOWEL GREEN STERILE (TOWEL DISPOSABLE) ×9 IMPLANT
TOWEL GREEN STERILE FF (TOWEL DISPOSABLE) ×3 IMPLANT
TRAY FOLEY W/BAG SLVR 16FR ST (SET/KITS/TRAYS/PACK) ×3 IMPLANT
TUBE CONNECTING 12X1/4 (SUCTIONS) IMPLANT
WATER STERILE IRR 1000ML POUR (IV SOLUTION) ×3 IMPLANT
YANKAUER SUCT BULB TIP NO VENT (SUCTIONS) ×3 IMPLANT
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
## 2023-06-25 NOTE — Progress Notes (Signed)
 Verbal order to continue with peri-op glycemic control protocol per Dr. Sampson Goon.   Michael Boston, RN

## 2023-06-25 NOTE — H&P (Signed)
 History:  The patient is a 58 year old male who presents for pre-operative visit in preparation for their removal of screw L3-SI, cage removal L5-SI ALIF w/ autograft, PLF L5-SI, which is scheduled on 05-28-23. The patient has had symptoms in the back including pain which has impacted their quality of life and ability to do activities of daily living. 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 The patient has had no previous surgery . The patient denies an active infection.   Past Medical History:  Diagnosis Date   Arthritis    Diabetes mellitus without complication (HCC)    Dysrhythmia 2011   no tests done   Hypertension     Allergies  Allergen Reactions   Atorvastatin Other (See Comments)    Muscle spasms     No current facility-administered medications on file prior to encounter.   Current Outpatient Medications on File Prior to Encounter  Medication Sig Dispense Refill   acetaminophen (TYLENOL) 500 MG tablet Take 500-1,000 mg by mouth every 6 (six) hours as needed for moderate pain (pain score 4-6).     amLODipine (NORVASC) 10 MG tablet Take 10 mg by mouth daily.     benazepril-hydrochlorthiazide (LOTENSIN HCT) 20-25 MG tablet Take 1 tablet by mouth daily.     Cholecalciferol (D3 PO) Take 1 tablet by mouth daily.     empagliflozin (JARDIANCE) 25 MG TABS tablet Take 25 mg by mouth daily.     metFORMIN (GLUCOPHAGE) 1000 MG tablet Take 2,000 mg by mouth daily.     Multiple Vitamins-Minerals (MULTIVITAMINS THER. W/MINERALS) TABS Take 1 tablet by mouth daily.     omega-3 acid ethyl esters (LOVAZA) 1 g capsule Take 2 g by mouth daily.  12    Physical Exam: Vitals:   06/25/23 0548  BP: (!) 154/97  Pulse: 79  Resp: 18  Temp: 98.8 F (37.1 C)  SpO2: 98%   Body mass index is 26.29 kg/m. Clinical exam: Vedansh is a pleasant individual, who appears younger than their stated  age.   He is alert and orientated 3.   No shortness of breath, chest pain.   Abdomen is soft and non-tender, negative loss of bowel and bladder control, no rebound tenderness.   Negative: skin lesions abrasions contusions  Peripheral pulses: 2+ peripheral pulses.  LE compartments are: Soft and nontender.  Gait pattern: Normal  Assistive devices: None  Neuro: 5/5 motor strength in the lower extremity bilaterally. Negative straight leg raise test. No clonus, negative Babinski test. Normal sensation light touch.  Musculoskeletal: Significant back pain with palpation and range of motion. Pain is midline radiating into the paraspinal region. Well-healed surgical scars from prior surgeries. No SI joint pain.   Imaging: X-rays of the lumbar spine demonstrate intervertebral fusion with cages L3-S1. There is lysis noted around the L5-S1 intervertebral cage. Patient has posterior pedicle screw fixation L3-S1. Minimal adjacent L2-3 degenerative disease.  CT scan of the lumbar spine: completed on 08/29/2022. Pseudoarthrosis is noted at L5-S1. Bilateral vacuum phenomenon at the S1 pedicle screws. Moderate spinal stenosis at L2-3. Moderate residual stenosis at L3-4. Solid L3-4 fusion. Solid L4-5 fusion with no residual stenosis. Vacuum phenomenon and lysis noted around the intervertebral cage consistent with a pseudoarthrosis at L5-S1.  CT scan of the lumbar spine completed on 03/28/2023 There appears to be no significant change when compared to his prior CT scan. They has a persistent nonunion at  L5-S1 with vacuum phenomenon surrounding the cage as well as the S1 pedicle screws. The L5 pedicle screw does violate the anterior cortex of L5 but only abuts the iliac vessel. Patient appears to have a solid L3-4 L4-5 fusion with no residual stenosis.  A/P: Ranon is a very pleasant 58 year old gentleman with ongoing severe back pain. Based on his imaging studies I believe he has a pseudoarthrosis and  symptomatic hardware. The patient's last surgery was in 2019 and he states he has been having progressive debilitating pain for the last 5 years. We discussed surgical and nonsurgical treatment. He is expressed a desire to address the issue at L5-S1.    Recommendations: 1. Removal of the posterior pedicle screw construct L3-S1. Since he has a solid fusion L3-4 and L4-5 I do not think replacement of the pedicle screws at this level is required. I would then retap the L5 and S1 screws and placed new screws at this level that are larger in diameter. Additional bone graft will be placed in the posterior lateral gutter.  2. After replacing the pedicle screws I would then close the wound and through an anterior approach remove the intervertebral cage at L5-S1 and perform a standard anterior lumbar interbody fusion with autograft.  3. Once the ALIF was completed I would then return to the posterior incision and placed rods to complete the posterior lateral arthrodesis and fusion at L5-S1.    Implants: Globus titanium intervertebral cage. NuVasive pedicle screws.  Graft: Autograft from iliac crest bone graft site. Will obtain bone graft from separate incision. If needed would supplement bone graft with DBX mix.    I have explained the surgical procedure in great detail with the patient and he is expressed a desire to move forward with surgery.    Risks and benefits of anterior lumbar fusion surgery were discussed with the patient. These include: Infection, bleeding, death, stroke, paralysis, ongoing or worse pain, need for additional surgery, nonunion, leak of spinal fluid, adjacent segment degeneration requiring additional fusion surgery, need for posterior decompression and/or fusion. Bleeding from major vessels, and blood clots (deep venous thrombosis)requiring additional treatment, loss in bowel and bladder control, injury to bladder, ureters, and other major abdominal organs.  (risks for men also include  retrograde ejaculation)  Risks and benefits of posterior spinal fusion: Infection, bleeding, death, stroke, paralysis, ongoing or worse pain, need for additional surgery, nonunion, leak of spinal fluid, adjacent segment degeneration requiring additional fusion surgery, Injury to abdominal vessels that can require anterior surgery to stop bleeding. Malposition of the cage and/or pedicle screws that could require additional surgery. Loss of bowel and bladder control. Postoperative hematoma causing neurologic compression that could require urgent or emergent re-operation.

## 2023-06-25 NOTE — Anesthesia Procedure Notes (Signed)
 Anesthesia Regional Block: TAP block   Pre-Anesthetic Checklist: , timeout performed,  Correct Patient, Correct Site, Correct Laterality,  Correct Procedure, Correct Position, site marked,  Risks and benefits discussed,  Surgical consent,  Pre-op evaluation,  At surgeon's request and post-op pain management  Laterality: Left  Prep: chloraprep       Needles:  Injection technique: Single-shot  Needle Type: Echogenic Needle     Needle Length: 9cm  Needle Gauge: 21     Additional Needles:   Procedures:,,,, ultrasound used (permanent image in chart),,    Narrative:  Start time: 06/25/2023 7:07 AM End time: 06/25/2023 7:14 AM Injection made incrementally with aspirations every 5 mL.  Performed by: Personally  Anesthesiologist: Marcene Duos, MD

## 2023-06-25 NOTE — Discharge Instructions (Signed)

## 2023-06-25 NOTE — Brief Op Note (Signed)
 06/25/2023  3:20 PM  PATIENT:  Gary Luna  58 y.o. male  PRE-OPERATIVE DIAGNOSIS:  Failed back syndrome with L5-S1 nonunion  POST-OPERATIVE DIAGNOSIS:  Failed back syndrome with L5-S1 nonunion  PROCEDURE:  Procedure(s) with comments: REMOVAL POSTERIOR PEDICLE SCREWS AND INTERBODY CAGE (N/A) - 8 HRS DR. CLARK TO DO APPROACH LEFT TAP BLOCK WITH EXPAREL 3 C-BED ANTERIOR LUMBAR INTERBODY FUSION LUMBAR FIVE-SACRAL ONE WITH INTERVERTEBRAL CAGE (N/A) - 8 HRS DR. CLARK TO DO APPROACH LEFT TAP BLOCK WITH EXPAREL 3 C-BED ANTERIOR ILIAC CREST BONE GRAFT HARVEST (Left) ABDOMINAL EXPOSURE (N/A) POSTERIOR PEDICLE SCREW FIXATION AND ARTHRODESIS LUMBAR FIVE-SACRAL ONE (N/A)  SURGEON:  Surgeons and Role: Panel 1:    Venita Lick, MD - Primary Panel 2:    * Cephus Shelling, MD - Primary  PHYSICIAN ASSISTANT: Luther Bradley ,PA  ANESTHESIA:   general  EBL:  350 mL   BLOOD ADMINISTERED:none  DRAINS: none   LOCAL MEDICATIONS USED:  MARCAINE     SPECIMEN:  No Specimen  DISPOSITION OF SPECIMEN:  N/A  COUNTS:  YES  TOURNIQUET:  * No tourniquets in log *  DICTATION: .Dragon Dictation  PLAN OF CARE: Admit to inpatient   PATIENT DISPOSITION:  PACU - hemodynamically stable.

## 2023-06-25 NOTE — Anesthesia Preprocedure Evaluation (Addendum)
 Anesthesia Evaluation  Patient identified by MRN, date of birth, ID band Patient awake    Reviewed: Allergy & Precautions, NPO status , Patient's Chart, lab work & pertinent test results  Airway Mallampati: II  TM Distance: >3 FB Neck ROM: Full    Dental  (+) Dental Advisory Given   Pulmonary neg pulmonary ROS   breath sounds clear to auscultation       Cardiovascular hypertension, Pt. on medications  Rhythm:Regular Rate:Normal     Neuro/Psych negative neurological ROS     GI/Hepatic negative GI ROS, Neg liver ROS,,,  Endo/Other  diabetes, Type 2    Renal/GU negative Renal ROS     Musculoskeletal  (+) Arthritis ,    Abdominal   Peds  Hematology negative hematology ROS (+)   Anesthesia Other Findings   Reproductive/Obstetrics                             Anesthesia Physical Anesthesia Plan  ASA: 2  Anesthesia Plan: General   Post-op Pain Management: Tylenol PO (pre-op)*, Ketamine IV* and Regional block*   Induction: Intravenous  PONV Risk Score and Plan: 2 and Dexamethasone, Ondansetron and Treatment may vary due to age or medical condition  Airway Management Planned: Oral ETT  Additional Equipment: Arterial line  Intra-op Plan:   Post-operative Plan: Extubation in OR  Informed Consent: I have reviewed the patients History and Physical, chart, labs and discussed the procedure including the risks, benefits and alternatives for the proposed anesthesia with the patient or authorized representative who has indicated his/her understanding and acceptance.     Dental advisory given  Plan Discussed with: CRNA  Anesthesia Plan Comments:        Anesthesia Quick Evaluation

## 2023-06-25 NOTE — Op Note (Signed)
 Date: June 25, 2023  Preoperative diagnosis: Severe chronic lower back pain  Postoperative diagnosis: Same  Procedure: Anterior spine exposure via anterior retroperitoneal approach for L5-S1 ALIF  Surgeon: Dr. Cephus Shelling, MD  Co-surgeon: Dr. Venita Lick, MD  Assistant: Roderic Palau, PA  Indications: 58 year old male with chronic lower back pain that has pseudoarthrosis and symptomatic hardware following back surgery in 2019.  Today he is having removal of his posterior pedicle screws from L3-S1 and then he is going to be flipped supine and I have been asked to expose the L5-S1 for ALIF.  He presents after risks benefits discussed.  Findings: Fluoroscopic C arm in the lateral position was used to mark the L5-S1 disc space over the left rectus muscle.  Transverse incision was made here and after opening the anterior rectus sheath, I raised flaps and mobilized the rectus muscle to the midline.  I went lateral to the muscle into the retroperitoneum and mobilized peritoneum and left ureter across midline.  Middle sacral vessels were divided between clips.  Mobilized on both sides of the disc space to get the L5-S1 level exposed from the front and mobilized left iliac vein laterally.  Fixed NuVasive retractor was placed.  Anesthesia: General  Details: Patient was taken to the operating room after informed consent was obtained early this morning.  He was initially placed prone and his posterior pedicle screws were removed from L3-S1 by Dr. Shon Baton.  He was then flipped in the supine position.  I was called in the room when Dr. Shon Baton was ready for our anterior exposure.  We used a fluoroscopic C arm in the lateral position to mark the L5-S1 disc space that was marked over the left rectus muscle.  A timeout was performed.  Antibiotics were up-to-date.  Transverse incision was made here and I used cerebellum retractors to dissect through the subcutaneous tissue.  The anterior rectus sheath was  opened transversely.  I raise flaps underneath the anterior rectus sheath and mobilized the left rectus muscle to the midline.  Entered lateral to muscle and then mobilized peritoneum and left ureter to the midline with blunt dissection using Kd.  My assistant used hand-held Wiley retractors pulled peritoneum and left ureter to the midline.  I visualized the left psoas as well as the left iliac vessels.  Mobilized all the way to the disc space and found the middle sacral vessels.  These were quite scarred from his previous posterior fusion with screws.  I was able to get right angle slamps around these vessels and I placed clips and these were divided.  I then mobilized on both sides of the L5-S1 disc space including mobilizing left iliac vein lateral.  I had pretty good working room.  I placed a fixed NuVasive retractor on the field.  160 reverse lips were placed on each side of the disc space with a 160 reverse lip caudal and a 140 reverse lip cranial.  We had good anterior exposure.  A needle was placed in the disc space and confirmed on lateral fluoroscopy that we were at the correct L5-S1 level.  Case was turned over to Dr. Shon Baton.  Complication: None  Condition: Stable  Cephus Shelling, MD [facility] Office: [phone]   Cephus Shelling

## 2023-06-25 NOTE — Op Note (Signed)
 OPERATIVE REPORT  DATE OF SURGERY: 06/25/2023  PATIENT NAME:  Gary Luna MRN: [id number] DOB: 06-09-1965  PCP: Soundra Pilon, FNP  PRE-OPERATIVE DIAGNOSIS:   Prior L3-S1 instrumented fusion with pedicle screws and intervertebral cages.  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  POST-OPERATIVE DIAGNOSIS: Same  PROCEDURE:   1.  Posterior removal of pedicle screw construct L3-S1 with reimplantation of larger pedicle screws L5-S1. 2.  Posterior lateral arthrodesis L5-S1. 3.  Anterior iliac crest bone graft harvest 4.  Anterior lumbar interbody fusion L5-S1 with removal of intervertebral cage  SURGEON:  Venita Lick, MD  PHYSICIAN ASSISTANT: Luther Bradley, [place]  Approach Surgeon Dr. Sherald Hess  ANESTHESIA:   General  EBL: 350 ml   Complications: None  Implants: Pedicle screws: L5: 8.5 x 50 mm length.  S1: 8.5 x 45 mm length.  40 mm length rod Anterior interbody cage: Globus intervertebral cage 13 x 39 x 29 with 15 degrees lordosis.  25 mm length anchors.  Explanted: 7.5 x 55 mm length pedicle screws L3, L4, and L5.  7.5 x 50 mm length pedicle screw S1 bilaterally.  Removed expandable TLIF cage.  Neuromonitoring: No adverse free running SSEP or EMG activity during the procedure that was monitored.  Once the new pedicle screws were placed they were directly stimulated.  Left side: L5 no activity greater than 40 mA.  S1: Positive activity at 15 mA.  Right side: L5: Positive activity at 28 mA.  S1: Positive activity at 27 mA  Graft: Autograft from anterior iliac crest bone graft harvest with DBX and DBX mix supplementation*  BRIEF HISTORY: Gary Luna is a 58 y.o. male who has had multiple lumbar surgeries culminating in an L3-S1 instrumented fusion with pedicle screws and posterior based intervertebral cages.  He unfortunately has had continued back pain with intermittent neuropathic leg pain.  Imaging studies  demonstrated that the L5 screws were protruding from the anterior aspect of the vertebral body and that there was lysis around the screw itself.  There is also no evidence of bony fusion of the L5-S1 intervertebral cage.  Despite appropriate conservative management the patient's quality of life is continued to deteriorate.  After extensive discussion it was felt as though his nonunion may be the source of his pain.  As a result we elected to remove the hardware and revise the fusion at L5-S1.  Since the L3-L5 fusion was intact and solid there was no need to address these levels.  All appropriate risks, benefits, and alternatives to surgery were discussed with the patient and consent was obtained.  PROCEDURE DETAILS: Patient was brought into the operating room and was properly positioned on the operating room table.  After induction with general anesthesia the patient was endotracheally intubated.  A timeout was taken to confirm all important data: including patient, procedure, and the level. Teds, SCD's were applied.   The nurse placed a Foley and the neuromonitoring rep placed all appropriate needles.  The patient was then turned prone onto the [facility] spine frame.  All bony prominences were well-padded and the back was prepped and draped in a standard fashion.  The old incision was marked and infiltrated with 1/4% Marcaine with epinephrine.  I reincised the previous incision and sharply dissected down to the deep fascia.  Retractors were placed and I incised the deep fascia and I continued to dissect centrally and then slightly out laterally ultimately I was able to palpate the pedicle screws and  I am completely expose the pedicle screw heads at L3, 4, 5, and S1.  Once this was done bilaterally I then identified the cross-link and exposed it.  I then removed all 8 locking caps.  I then loosened the locking mechanism of the cross-link to each of the rods and centrally.  I then exposed the entire rod and  using various devices I was able to mobilize and ultimately remove the rods.  I then removed the L3 and L4 pedicle screws bilaterally.  I placed thrombin Gelfoam and Floseal into each of the screw holes to help minimize blood loss.  The L5 pedicle screw was then removed on the left side and then I palpated the hole gently with a ball-tipped feeler.  This was a 7.5 x 55 mm length screw.  I elected to tap with a 7.5 mm tap.  I then palpated the hole again with a ball-tipped feeler and then placed the 8.5 x 50 mm pedicle screw.  This screw itself had excellent overall purchase.  I repeated the same exact technique on the contralateral side.  I then removed the S1 screw and palpated the hole with a ball-tipped feeler.  I then tapped and then repalpated.  I placed the 8.5 x 45 mm length screw.  This was done bilaterally.  With all the new screws placed at L4 L5-S1 and the other hardware removed I irrigated the wound copiously with normal saline.  I then loosely reapproximated the deep fascia with interrupted #1 Vicryl sutures and I closed the skin with staples.  A bulky dry dressing was then applied.  The drapes were removed and the flat Jean Rosenthal table was secured over the patient.  Once the patient was properly secured he was then rotated into the supine position.  At this point with the patient's supine the abdomen was now prepped and draped in a standard fashion.  A timeout was done again because of the change in position.  We confirmed patient procedure and all other important data.  The left anterior iliac crest was palpated and I marked out my incision and infiltrated with quarter percent Marcaine with epinephrine.  A small incision was made and I sharply dissected down to the iliac crest.  With the iliac crest exposed I used the QuickDraw iliac crest bone graft harvesting device to harvest approximately 5 and half cc of bone graft.  Multiple passes were made and I had excellent iliac crest bone graft harvest.   Once I completed the autograft harvest I irrigated the wound copiously with normal saline.  I then placed Floseal and thrombin Gelfoam and the resulting tunnel was created from the bone graft harvest.  I then closed the deep fascia with #1 Vicryl sutures followed by superficial 2-0 Vicryl sutures and a 3-0 Monocryl for the skin.  A dry dressing was applied.  At this point Dr. Chestine Spore scrubbed into the case and performed a standard anterior retroperitoneal approach the L5-S1 disc space.  Please refer to his dictation for specifics on the approach.  Once the retractor was positioned by Dr. Chestine Spore a needle was placed into the L5-S1 disc space and I confirmed with fluoroscopy that we are at the appropriate level.  At this point Dr. Chestine Spore scrubbed out and I continued on with the case.  An annulotomy was performed with a 10 blade scalpel and I removed a large portion of disc material.  The intervertebral cage was now visible.  The cage itself was mobile and it did  not appear to be fused.  In addition I could place a lamina spreader in the disc space and using live fluoroscopy confirmed that I had motion of the intervertebral space.  Confirming the pseudoarthrosis I continued with my discectomy working lateral to the cage.  Once I was able to get lateral to the cage I placed a hook and grasp the leading edge of the cage.  I then gently malleted the cage out.  Removing the cage was not difficult or tedious.  There was no significant bleeding once the cage was out.  I then continued with the discectomy.  Using curettes and Kerrison rongeurs as well as pituitary rongeurs I removed a significant amount of additional disc material posteriorly.  I then confirmed that my nerve hook was just on the posterior aspect of the vertebral body of S1.  I then released the annulus and under live fluoroscopy confirmed to had parallel endplate distraction.  I rasped the endplates and irrigated the wound copiously normal saline.  I then placed  an trialing devices and elected to use the large 13 mm height 15 degree cage.  This provided the best overall fill of the disc space as well as increased disc height providing for indirect foraminal decompression.  The cage was obtained and packed with the autograft and some additional DBX on the surface.  I then gently malleted the cage into the disc space using fluoroscopy.  Once I was at the appropriate depth I then inserted the staples through the cage and into the L5 and S1 endplates.  There were 2 into the L5 vertebral body and 1 into the S1 vertebral body.  I then removed the inserting device and irrigated the wound copiously normal saline.  I sequentially removed the retractor blades confirming that hemostasis with no active bleeding.  Once all the retractor blades were removed I irrigated again and then closed the fascia of the rectus with a #1 strata fix suture.  We then closed superficially with interrupted 2-0 Vicryl sutures followed by 3-0 Monocryl.  An AP x-ray was done and read by the radiologist prior to completing the closure confirming there were no retained surgical instruments in the field.  With the ALIF and an iliac crest bone graft harvest complete we then turned our attention to going back posteriorly to place the rods and bone graft.  The [facility] spine frame was secured over the patient and he was gently rotated into the prone position again.  All bony prominences were well-padded and the back was prepped and draped in a standard fashion.  Timeout was taken again confirming procedure patient and all other important data.  I removed the staples and the #1 Vicryl sutures and expose the hardware.  I irrigated the wound copiously with normal saline and made sure that hemostasis.  I then used a high-speed bur to decorticate the sacral ala and L5-S1 facet complex bilaterally.  DBX mix bone graft was placed in the posterior lateral gutters bilaterally.  The polyaxial head and L S1 was applied and  a appropriate size rod was placed and the locking caps inserted.  The locking caps were tightened and torqued according manufacture standards.  Once all 4 locking caps were secured I removed the insertional tabs from the S1 screws.  Imaging at this point include 2 view fluoroscopy which demonstrated satisfactory positioning of the posterior hardware as well as the anterior intervertebral cage.  After irrigating the posterior wound again I placed vancomycin into the wound and  closed the deep fascia with a running #1 strata fix suture.  Superficial was closed with interrupted 2-0 Vicryl sutures and finally 3-0 Monocryl for the skin.  Steri-Strips and dry dressings were applied and the patient was ultimately extubated and transferred the PACU without incident.  The end of the case all needle and sponge counts were correct.  There were no adverse intraoperative events.  Venita Lick, MD 06/25/2023 2:59 PM

## 2023-06-25 NOTE — Anesthesia Procedure Notes (Signed)
 Procedure Name: Intubation Date/Time: 06/25/2023 7:52 AM  Performed by: Susy Manor, CRNAPre-anesthesia Checklist: Patient identified, Emergency Drugs available, Suction available and Patient being monitored Patient Re-evaluated:Patient Re-evaluated prior to induction Oxygen Delivery Method: Circle System Utilized Preoxygenation: Pre-oxygenation with 100% oxygen Induction Type: IV induction Ventilation: Mask ventilation without difficulty Laryngoscope Size: Mac and 4 Grade View: Grade II Tube type: Oral Tube size: 7.5 mm Number of attempts: 1 Airway Equipment and Method: Stylet and Oral airway Placement Confirmation: ETT inserted through vocal cords under direct vision, positive ETCO2 and breath sounds checked- equal and bilateral Secured at: 23 cm Tube secured with: Tape Dental Injury: Teeth and Oropharynx as per pre-operative assessment

## 2023-06-25 NOTE — Anesthesia Procedure Notes (Signed)
 Arterial Line Insertion Start/End3/06/2023 7:00 AM Performed by: Garfield Cornea, CRNA, CRNA  Patient location: Pre-op. Preanesthetic checklist: patient identified, IV checked, site marked, risks and benefits discussed, surgical consent, monitors and equipment checked, pre-op evaluation, timeout performed and anesthesia consent Lidocaine 1% used for infiltration Right, radial was placed Catheter size: 20 G Hand hygiene performed  and maximum sterile barriers used   Attempts: 1 Procedure performed without using ultrasound guided technique. Following insertion, dressing applied and Biopatch. Post procedure assessment: normal and unchanged  Patient tolerated the procedure well with no immediate complications.

## 2023-06-25 NOTE — Transfer of Care (Signed)
 Immediate Anesthesia Transfer of Care Note  Patient: Kaion Tisdale  Procedure(s) Performed: REMOVAL POSTERIOR PEDICLE SCREWS AND INTERBODY CAGE (Spine Lumbar) ANTERIOR LUMBAR INTERBODY FUSION LUMBAR FIVE-SACRAL ONE WITH INTERVERTEBRAL CAGE (Spine Lumbar) ANTERIOR ILIAC CREST BONE GRAFT HARVEST (Left: Hip) POSTERIOR PEDICLE SCREW FIXATION AND ARTHRODESIS LUMBAR FIVE-SACRAL ONE (Spine Lumbar) ABDOMINAL EXPOSURE (Abdomen)  Patient Location: PACU  Anesthesia Type:General  Level of Consciousness: awake  Airway & Oxygen Therapy: Patient Spontanous Breathing and Patient connected to nasal cannula oxygen  Post-op Assessment: Report given to RN and Post -op Vital signs reviewed and stable  Post vital signs: Reviewed and stable  Last Vitals:  Vitals Value Taken Time  BP 138/77 06/25/23 1545  Temp    Pulse 89 06/25/23 1545  Resp 15 06/25/23 1545  SpO2 96 % 06/25/23 1545  Vitals shown include unfiled device data.  Last Pain:  Vitals:   06/25/23 0618  TempSrc:   PainSc: 0-No pain         Complications: No notable events documented.

## 2023-06-26 ENCOUNTER — Inpatient Hospital Stay (HOSPITAL_COMMUNITY)

## 2023-06-26 DIAGNOSIS — I82409 Acute embolism and thrombosis of unspecified deep veins of unspecified lower extremity: Secondary | ICD-10-CM

## 2023-06-26 LAB — GLUCOSE, CAPILLARY
Glucose-Capillary: 132 mg/dL — ABNORMAL HIGH (ref 70–99)
Glucose-Capillary: 189 mg/dL — ABNORMAL HIGH (ref 70–99)
Glucose-Capillary: 157 mg/dL — ABNORMAL HIGH (ref 70–99)

## 2023-06-26 NOTE — Progress Notes (Signed)
 Bilateral lower extremity venous duplex has been completed. Preliminary results can be found in CV Proc through chart review.   06/26/23 12:54 PM Olen Cordial RVT

## 2023-06-26 NOTE — Evaluation (Signed)
 Physical Therapy Evaluation Patient Details Name: Gary Luna MRN: [id number] DOB: [birth year] Today's Date: 06/26/2023  History of Present Illness  Gary Luna is a 58 y.o. male who presents on 06/25/23 to [facility] hospital for surgery with the diagnosis of failed back syndrome with L5-S1 nonunion. Pt underwent removal of past hardware, ALIF at L5-S1 with autograft from L iliac crest, PLIF L5-S1. PMHx includes arthritis, DM, dysrhythmia, HTN  Clinical Impression  Pt admitted with above diagnosis. Pt from home where he was independent and working. Presents this AM with pain predominately in L hip from bone graft. Pt with decreased L hip flex strength and tightness L knee due to this pain. Pt with antalgic gait pattern, recommend he use RW for ambulation until hip pain decreases and gait is symmetrical. Pt educated on precautions and activity modifications. Recommend outpt PT before pt goes back to work.  Pt currently with functional limitations due to the deficits listed below (see PT Problem List). Pt will benefit from acute skilled PT to increase their independence and safety with mobility to allow discharge.           If plan is discharge home, recommend the following: Assistance with cooking/housework   Can travel by private vehicle        Equipment Recommendations BSC/3in1  Recommendations for Other Services       Functional Status Assessment Patient has had a recent decline in their functional status and demonstrates the ability to make significant improvements in function in a reasonable and predictable amount of time.     Precautions / Restrictions Precautions Precautions: Back Precaution Booklet Issued: Yes (comment) Recall of Precautions/Restrictions: Intact Required Braces or Orthoses: Spinal Brace Spinal Brace: Lumbar corset;Applied in sitting position Restrictions Weight Bearing Restrictions Per Provider Order: No      Mobility  Bed Mobility Overal bed mobility: Needs  Assistance Bed Mobility: Rolling, Sidelying to Sit, Sit to Supine Rolling: Supervision Sidelying to sit: Supervision   Sit to supine: Supervision   General bed mobility comments: pt shows goos understanding of log rolling. Supervision and vc's. Use of rail    Transfers Overall transfer level: Needs assistance Equipment used: Rolling walker (2 wheels) Transfers: Sit to/from Stand Sit to Stand: Supervision           General transfer comment: vc's for hand placement. Pt with increased pain with transitions    Ambulation/Gait Ambulation/Gait assistance: Supervision Gait Distance (Feet): 300 Feet Assistive device: Rolling walker (2 wheels) Gait Pattern/deviations: Step-through pattern, Antalgic, Decreased weight shift to left Gait velocity: decreased Gait velocity interpretation: 1.31 - 2.62 ft/sec, indicative of limited community ambulator   General Gait Details: vc's for posture. Pt needs to use RW at this time due to antalgia from L hip. Encouraged to continue to RW until able to achieve even gait pattern  Stairs Stairs:  (pt does not have but education given for sequencing in case he encounters)          Wheelchair Mobility     Tilt Bed    Modified Rankin (Stroke Patients Only)       Balance Overall balance assessment: Mild deficits observed, not formally tested                                           Pertinent Vitals/Pain Pain Assessment Pain Assessment: Faces Faces Pain Scale: Hurts whole lot Pain Location: L hip Pain  Descriptors / Indicators: Constant, Sore Pain Intervention(s): Limited activity within patient's tolerance, Monitored during session    Home Living Family/patient expects to be discharged to:: Private residence Living Arrangements: Alone Available Help at Discharge: Friend(s);Available PRN/intermittently Type of Home: House Home Access: Level entry       Home Layout: One level Home Equipment: Agricultural consultant (2  wheels);Cane - single point Additional Comments: has a friend staying with him short term    Prior Function Prior Level of Function : Independent/Modified Independent;Working/employed;Driving             Mobility Comments: his job requires bending and lifting. Will be off 12 weeks ADLs Comments: independent     Extremity/Trunk Assessment   Upper Extremity Assessment Upper Extremity Assessment: Defer to OT evaluation    Lower Extremity Assessment Lower Extremity Assessment: LLE deficits/detail LLE Deficits / Details: stiffness noted LLE with knee ext, pt feels it at L hip. Knee ext 5/5, hip flex 3/5 due to pain LLE Sensation: WNL LLE Coordination: WNL    Cervical / Trunk Assessment Cervical / Trunk Assessment: Back Surgery  Communication   Communication Communication: No apparent difficulties    Cognition Arousal: Alert Behavior During Therapy: WFL for tasks assessed/performed   PT - Cognitive impairments: No apparent impairments                         Following commands: Intact       Cueing Cueing Techniques: Verbal cues     General Comments General comments (skin integrity, edema, etc.): discussed activity modifications and frequency of ambulation at home    Exercises     Assessment/Plan    PT Assessment Patient needs continued PT services  PT Problem List Decreased strength;Decreased activity tolerance;Decreased balance;Decreased mobility;Decreased range of motion;Decreased knowledge of precautions;Pain       PT Treatment Interventions DME instruction;Gait training;Functional mobility training;Therapeutic activities;Therapeutic exercise;Balance training;Patient/family education    PT Goals (Current goals can be found in the Care Plan section)  Acute Rehab PT Goals Patient Stated Goal: decreased pain L hip PT Goal Formulation: With patient Time For Goal Achievement: 07/03/23 Potential to Achieve Goals: Good    Frequency Min 5X/week      Co-evaluation               AM-PAC PT "6 Clicks" Mobility  Outcome Measure Help needed turning from your back to your side while in a flat bed without using bedrails?: None Help needed moving from lying on your back to sitting on the side of a flat bed without using bedrails?: None Help needed moving to and from a bed to a chair (including a wheelchair)?: None Help needed standing up from a chair using your arms (e.g., wheelchair or bedside chair)?: A Little Help needed to walk in hospital room?: A Little Help needed climbing 3-5 steps with a railing? : A Little 6 Click Score: 21    End of Session Equipment Utilized During Treatment: Back brace Activity Tolerance: Patient tolerated treatment well Patient left: in bed;with call bell/phone within reach;with family/visitor present Nurse Communication: Mobility status PT Visit Diagnosis: Pain;Difficulty in walking, not elsewhere classified (R26.2) Pain - Right/Left: Left Pain - part of body: Hip    Time: 0822-0848 PT Time Calculation (min) (ACUTE ONLY): 26 min   Charges:   PT Evaluation $PT Eval Low Complexity: 1 Low PT Treatments $Gait Training: 8-22 mins PT General Charges $$ ACUTE PT VISIT: 1 Visit  Lyanne Co, PT  Acute Rehab Services Secure chat preferred Office [phone]   Lawana Chambers Yides Saidi 06/26/2023, 9:09 AM

## 2023-06-26 NOTE — Evaluation (Signed)
 Occupational Therapy Evaluation Patient Details Name: Gary Luna MRN: [id number] DOB: 1965/10/20 Today's Date: 06/26/2023   History of Present Illness   Gary Luna is a 58 y.o. male who presents on 06/25/23 to [facility] hospital for surgery with the diagnosis of failed back syndrome with L5-S1 nonunion. Pt underwent removal of past hardware, ALIF at L5-S1 with autograft from L iliac crest, PLIF L5-S1. PMHx includes arthritis, DM, dysrhythmia, HTN     Clinical Impressions Pt evaluated s/p the admission list above. At baseline, pt lives at home alone, works, and completed all ADLs/IADLs and functional mobility without use of DME. Upon evaluation, pt was limited by 7/10 L hip pain and knowledge of AE/DME. Pt with great recall of back precautions and was able to adhere functionally. Overall, pt required up to supervision for all aspects of functional mobility using RW. Pt unable to achieve figure 4 with LLE due to L hip pain but was able to achieve with RLE. Pt educated on AE to increase pt independence. Pt verbalized understanding. Anticipates that once L hip pain subsides, pt will require less assistance with LBD tasks. Based on evaluation, pt will require up to MIN A for ADLs. OT to continue following pt acutely to address functional needs.     If plan is discharge home, recommend the following:   A little help with walking and/or transfers;A little help with bathing/dressing/bathroom;Assistance with cooking/housework;Assist for transportation     Functional Status Assessment   Patient has had a recent decline in their functional status and demonstrates the ability to make significant improvements in function in a reasonable and predictable amount of time.     Equipment Recommendations   BSC/3in1     Recommendations for Other Services         Precautions/Restrictions   Precautions Precautions: Back Precaution Booklet Issued: Yes (comment) Recall of Precautions/Restrictions:  Intact Precaution/Restrictions Comments: Reviewed handout provided by PT Required Braces or Orthoses: Spinal Brace Spinal Brace: Lumbar corset;Applied in standing position Restrictions Weight Bearing Restrictions Per Provider Order: No     Mobility Bed Mobility Overal bed mobility: Needs Assistance Bed Mobility: Sidelying to Sit   Sidelying to sit: Supervision       General bed mobility comments: Pt received sidelying in bed. Pt able to transition to seated EOB with supervision while adhering to back precautions    Transfers Overall transfer level: Needs assistance Equipment used: Rolling walker (2 wheels) Transfers: Sit to/from Stand Sit to Stand: Supervision           General transfer comment: Good carryover for B hand placement from PT evaluation. Pt continues to express increased pain with transition      Balance Overall balance assessment: Needs assistance Sitting-balance support: No upper extremity supported, Feet supported Sitting balance-Leahy Scale: Good Sitting balance - Comments: static sitting EOB   Standing balance support: No upper extremity supported, During functional activity Standing balance-Leahy Scale: Fair Standing balance comment: donned lumbar corset while standing unsupported                           ADL either performed or assessed with clinical judgement   ADL Overall ADL's : Needs assistance/impaired Eating/Feeding: Independent   Grooming: Supervision/safety;Standing   Upper Body Bathing: Supervision/ safety;Sitting   Lower Body Bathing: Minimal assistance;Sit to/from stand   Upper Body Dressing : Supervision/safety;Sitting;Standing   Lower Body Dressing: Sit to/from stand;Moderate assistance   Toilet Transfer: Supervision/safety;Comfort height toilet;Ambulation;Rolling walker (2 wheels)  Toileting- Clothing Manipulation and Hygiene: Supervision/safety;Sit to/from stand       Functional mobility during ADLs:  Supervision/safety;Rolling walker (2 wheels) General ADL Comments: Pt with reports of 7/10 L hip pain impacting functional performance. Pt unable to achieve figure 4 with LLE but was able to achieve with R. Pt educated on AE to improve pt independence in LBD tasks.     Vision Baseline Vision/History: 0 No visual deficits Ability to See in Adequate Light: 0 Adequate Patient Visual Report: No change from baseline Vision Assessment?: No apparent visual deficits     Perception Perception: Within Functional Limits       Praxis Praxis: Not tested       Pertinent Vitals/Pain Pain Assessment Pain Assessment: 0-10 Pain Score: 7  Pain Location: L hip Pain Descriptors / Indicators: Aching, Constant, Discomfort, Sore Pain Intervention(s): Limited activity within patient's tolerance, Monitored during session     Extremity/Trunk Assessment Upper Extremity Assessment Upper Extremity Assessment: Overall WFL for tasks assessed   Lower Extremity Assessment Lower Extremity Assessment: Defer to PT evaluation LLE Deficits / Details: stiffness noted LLE with knee ext, pt feels it at L hip. Knee ext 5/5, hip flex 3/5 due to pain LLE Sensation: WNL LLE Coordination: WNL   Cervical / Trunk Assessment Cervical / Trunk Assessment: Back Surgery   Communication Communication Communication: No apparent difficulties   Cognition Arousal: Alert Behavior During Therapy: WFL for tasks assessed/performed Cognition: No apparent impairments             OT - Cognition Comments: Pt answered all questions appropriately, able to recall back precautions and adhere functionally.                 Following commands: Intact       Cueing  General Comments   Cueing Techniques: Verbal cues  VSS on room air; friend present   Exercises     Shoulder Instructions      Home Living Family/patient expects to be discharged to:: Private residence Living Arrangements: Alone Available Help at  Discharge: Friend(s);Available PRN/intermittently Type of Home: House Home Access: Level entry     Home Layout: One level     Bathroom Shower/Tub: Producer, television/film/video: Standard     Home Equipment: Agricultural consultant (2 wheels);Cane - single point   Additional Comments: has a friend staying with him short term      Prior Functioning/Environment Prior Level of Function : Independent/Modified Independent;Working/employed;Driving             Mobility Comments: his job requires bending and lifting. Will be off 12 weeks ADLs Comments: independent    OT Problem List: Decreased strength;Decreased activity tolerance;Impaired balance (sitting and/or standing);Decreased knowledge of use of DME or AE;Decreased knowledge of precautions;Pain   OT Treatment/Interventions: Self-care/ADL training;Therapeutic exercise;DME and/or AE instruction;Therapeutic activities;Patient/family education;Balance training      OT Goals(Current goals can be found in the care plan section)   Acute Rehab OT Goals Patient Stated Goal: to get better OT Goal Formulation: With patient Time For Goal Achievement: 07/10/23 Potential to Achieve Goals: Good ADL Goals Additional ADL Goal #1: Pt will complete all ADLs with MOD I using AE as needed   OT Frequency:  Min 1X/week    Co-evaluation              AM-PAC OT "6 Clicks" Daily Activity     Outcome Measure Help from another person eating meals?: None Help from another person taking care of personal grooming?:  A Little Help from another person toileting, which includes using toliet, bedpan, or urinal?: A Little Help from another person bathing (including washing, rinsing, drying)?: A Little Help from another person to put on and taking off regular upper body clothing?: A Little Help from another person to put on and taking off regular lower body clothing?: A Lot 6 Click Score: 18   End of Session Equipment Utilized During Treatment:  Gait belt;Rolling walker (2 wheels) Nurse Communication: Mobility status  Activity Tolerance: Patient tolerated treatment well Patient left: in bed;with call bell/phone within reach;with family/visitor present  OT Visit Diagnosis: Unsteadiness on feet (R26.81);Other abnormalities of gait and mobility (R26.89);Muscle weakness (generalized) (M62.81)                Time: 1191-4782 OT Time Calculation (min): 25 min Charges:     Lynnda Shields 06/26/2023, 10:02 AM

## 2023-06-26 NOTE — Progress Notes (Addendum)
    Subjective: Procedure(s) (LRB): REMOVAL POSTERIOR PEDICLE SCREWS AND INTERBODY CAGE (N/A) ANTERIOR LUMBAR INTERBODY FUSION LUMBAR FIVE-SACRAL ONE WITH INTERVERTEBRAL CAGE (N/A) ANTERIOR ILIAC CREST BONE GRAFT HARVEST (Left) ABDOMINAL EXPOSURE (N/A) POSTERIOR PEDICLE SCREW FIXATION AND ARTHRODESIS LUMBAR FIVE-SACRAL ONE (N/A) 1 Day Post-Op  Patient reports pain as 4 on 0-10 scale. He states that most of his pain is from the hip from the iliac crest graft Reports decreased leg pain denies incisional back pain or abdominal pain Positive void Negative bowel movement Positive flatus Negative chest pain or shortness of breath  Objective: Vital signs in last 24 hours: Temp:  [97.7 F (36.5 C)-98.8 F (37.1 C)] 97.7 F (36.5 C) (03/04 1478) Pulse Rate:  [78-88] 80 (03/04 0632) Resp:  [15-22] 19 (03/04 0632) BP: (117-145)/(74-89) 120/83 (03/04 0632) SpO2:  [92 %-100 %] 98 % (03/04 2956)  Intake/Output from previous day: 03/03 0701 - 03/04 0700 In: 3480 [P.O.:480; I.V.:2800; IV Piggyback:200] Out: 1050 [Urine:700; Blood:350]  Labs: Recent Labs    06/25/23 1709  WBC 10.6*  RBC 4.36  HCT 40.9  PLT 219   Recent Labs    06/25/23 1709  CREATININE 0.95   No results for input(s): "LABPT", "INR" in the last 72 hours.  Physical Exam: Neurologically intact ABD soft Neurovascular intact Sensation intact distally Intact pulses distally Dorsiflexion/Plantar flexion intact Incision: scant drainage Body mass index is 26.29 kg/m.   Assessment/Plan:  Gary Luna is a very pleasant 58 year old male who is POD 1 L5-S1 ALIF and PSIF L5-S1 with removal of prior hardware with iliac crest bone graft harvest. Overall, the surgery went well and was without complications. Overall, post-operatively he is doing well and reports minimal pain. He reports that most of his pain is coming from the hip due to the iliac crest bone graft. He is ambulating, voiding, and passing flatus. He states that  his pain is well controlled. Dressing are C/D./I. I did change his bac incision honey comb dressing this morning.He is tolerating oral intake well. He will work with PT today. He can be D/C tomorrow or Thursday pending PT clearance.   PLAN:  Continue mobilization with physical therapy Continue care  Continue incentive spirometry  Continue wound/ dressing care  D/C to home tomorrow or Thursday pending PT clearance   [employer] [facility] [phone]

## 2023-06-26 NOTE — Anesthesia Postprocedure Evaluation (Signed)
 Anesthesia Post Note  Patient: Gary Luna  Procedure(s) Performed: REMOVAL POSTERIOR PEDICLE SCREWS AND INTERBODY CAGE (Spine Lumbar) ANTERIOR LUMBAR INTERBODY FUSION LUMBAR FIVE-SACRAL ONE WITH INTERVERTEBRAL CAGE (Spine Lumbar) ANTERIOR ILIAC CREST BONE GRAFT HARVEST (Left: Hip) POSTERIOR PEDICLE SCREW FIXATION AND ARTHRODESIS LUMBAR FIVE-SACRAL ONE (Spine Lumbar) ABDOMINAL EXPOSURE (Abdomen)     Patient location during evaluation: PACU Anesthesia Type: General Level of consciousness: awake and alert Pain management: pain level controlled Vital Signs Assessment: post-procedure vital signs reviewed and stable Respiratory status: spontaneous breathing, nonlabored ventilation, respiratory function stable and patient connected to nasal cannula oxygen Cardiovascular status: blood pressure returned to baseline and stable Postop Assessment: no apparent nausea or vomiting Anesthetic complications: no   No notable events documented.  Last Vitals:  Vitals:   06/26/23 0632 06/26/23 0820  BP: 120/83 (!) 139/90  Pulse: 80 92  Resp: 19 18  Temp: 36.5 C 36.6 C  SpO2: 98% 97%    Last Pain:  Vitals:   06/26/23 1339  TempSrc:   PainSc: 8                  [name] S

## 2023-06-26 NOTE — Progress Notes (Addendum)
 [facility] [place] [facility]  Subjective  - Doing well over all, on clears until he passes gas.  He denies abdominal pain, no N/V.   Objective 120/83 80 97.7 F (36.5 C) (Oral) 19 98%  Intake/Output Summary (Last 24 hours) at 06/26/2023 0700 Last data filed at 06/25/2023 1800 Gross per 24 hour  Intake 3480 ml  Output 1050 ml  Net 2430 ml    Palpable pedal pulses B Motor and sensation grossly intact B UE/LE Lungs non labored breathing Abdomin soft , without distension and dressing over incision is clean and dry  Assessment/Planning: POD # 1 ALIF exposure L5-S1   Pending flatus to advance diet Pain well controlled, abdomin soft Incision healing well, palpable pedal pulses with intact motor Stable from a vascular point of view    Mosetta Pigeon 06/26/2023 7:00 AM --  Laboratory Lab Results: Recent Labs    06/25/23 1709  WBC 10.6*  HGB 13.6  HCT 40.9  PLT 219   BMET Recent Labs    06/25/23 1709  CREATININE 0.95    COAG Lab Results  Component Value Date   INR 1.02 06/02/2011   No results found for: "PTT"   VASCULAR STAFF ADDENDUM: I have independently interviewed and examined the patient. I agree with the above.   Rande Brunt. Lenell Antu, MD [facility] Phone Number: [phone] 06/26/2023 8:31 AM

## 2023-06-27 ENCOUNTER — Encounter (HOSPITAL_COMMUNITY): Payer: Self-pay | Admitting: Orthopedic Surgery

## 2023-06-27 LAB — GLUCOSE, CAPILLARY
Glucose-Capillary: 117 mg/dL — ABNORMAL HIGH (ref 70–99)
Glucose-Capillary: 98 mg/dL (ref 70–99)
Glucose-Capillary: 151 mg/dL — ABNORMAL HIGH (ref 70–99)
Glucose-Capillary: 154 mg/dL — ABNORMAL HIGH (ref 70–99)

## 2023-06-27 NOTE — Progress Notes (Signed)
 Occupational Therapy Treatment Patient Details Name: Gary Luna MRN: [id number] DOB: [birth year] Today's Date: 06/27/2023   History of present illness Gary Luna is a 58 y.o. male who presents on 06/25/23 to [facility] hospital for surgery with the diagnosis of failed back syndrome with L5-S1 nonunion. Pt underwent removal of past hardware, ALIF at L5-S1 with autograft from L iliac crest, PLIF L5-S1. PMHx includes arthritis, DM, dysrhythmia, HTN   OT comments  Pt is making good progress towards acute OT goals. Pt continues to be limited by L hip pain. Pt able to recall back precautions at the beginning of session. Pt provided with skilled instruction and visual demonstration on use of AE to complete lower body dressing tasks. Pt with good carryover and able to demonstrate lower body dressing tasks using AE with MOD I while adhering to back precautions. Overall, pt required up to MOD I to complete all aspects for functional mobility. Increased time required to perform movements due to L hip pain. Pt does not have acute OT needs. OT will sign-off. Recommends pt discharge home with family support.       If plan is discharge home, recommend the following:  A little help with walking and/or transfers;A little help with bathing/dressing/bathroom;Assistance with cooking/housework;Assist for transportation   Equipment Recommendations  BSC/3in1    Recommendations for Other Services      Precautions / Restrictions Precautions Precautions: Fall;Back Precaution Booklet Issued: Yes (comment) Recall of Precautions/Restrictions: Intact Required Braces or Orthoses: Spinal Brace Spinal Brace: Lumbar corset;Applied in standing position Restrictions Weight Bearing Restrictions Per Provider Order: No       Mobility Bed Mobility Overal bed mobility: Needs Assistance             General bed mobility comments: Not tested on this date. Pt received seated in chair but was able to recall log roll technique  to safely complete bed mobility    Transfers Overall transfer level: Modified independent Equipment used: Rolling walker (2 wheels) Transfers: Sit to/from Stand Sit to Stand: Modified independent (Device/Increase time)           General transfer comment: Increased time required to complete STS due to L hip pain. No physical assistance required     Balance Overall balance assessment: Needs assistance Sitting-balance support: No upper extremity supported, Feet supported Sitting balance-Leahy Scale: Good Sitting balance - Comments: Pt engaged in lower body dressing seated in chair   Standing balance support: No upper extremity supported, During functional activity Standing balance-Leahy Scale: Fair Standing balance comment: donned lumbar corset while standing unsupported                           ADL either performed or assessed with clinical judgement   ADL Overall ADL's : Needs assistance/impaired                 Upper Body Dressing : Independent;Standing Upper Body Dressing Details (indicate cue type and reason): donned lumbar corset in standing without physical assistance Lower Body Dressing: Modified independent;With adaptive equipment;Cueing for compensatory techniques;Sit to/from stand Lower Body Dressing Details (indicate cue type and reason): Pt provided with skilled instruction and visual demonstration of use of AE to complete LBD. Pt able to doff/donn socks and thread BLE through pants using AE with MOD I. Toilet Transfer: Modified Independent;Ambulation;Regular Toilet;Rolling walker (2 wheels) Toilet Transfer Details (indicate cue type and reason): simulated; transferring to chair         Functional mobility during  ADLs: Modified independent;Rolling walker (2 wheels) General ADL Comments: Pt with reports of 6/10 LOP in L hip. Pt able to complete LBD using AE and compensatory strategies while adhering to back precautions. Pt required increased time  to perform functional mobility. No physical assistance needed    Extremity/Trunk Assessment Upper Extremity Assessment Upper Extremity Assessment: Overall WFL for tasks assessed   Lower Extremity Assessment Lower Extremity Assessment: Defer to PT evaluation        Vision   Vision Assessment?: No apparent visual deficits   Perception Perception Perception: Within Functional Limits   Praxis Praxis Praxis: Not tested   Communication Communication Communication: No apparent difficulties   Cognition Arousal: Alert Behavior During Therapy: WFL for tasks assessed/performed Cognition: No apparent impairments             OT - Cognition Comments: Pt answered all questions appropriately, able to recall back precautions and adhere functionally.                 Following commands: Intact        Cueing   Cueing Techniques: Verbal cues  Exercises      Shoulder Instructions       General Comments VSS on RA    Pertinent Vitals/ Pain       Pain Assessment Pain Assessment: 0-10 Pain Score: 6  Pain Location: L hip Pain Descriptors / Indicators: Aching, Constant, Discomfort Pain Intervention(s): Limited activity within patient's tolerance  Home Living                                          Prior Functioning/Environment              Frequency  Min 1X/week        Progress Toward Goals  OT Goals(current goals can now be found in the care plan section)  Progress towards OT goals: Progressing toward goals  Acute Rehab OT Goals Patient Stated Goal: to stop L hip pain OT Goal Formulation: With patient Time For Goal Achievement: 07/10/23 Potential to Achieve Goals: Good ADL Goals Additional ADL Goal #1: Pt will complete all ADLs with MOD I using AE as needed  Plan      Co-evaluation                 AM-PAC OT "6 Clicks" Daily Activity     Outcome Measure   Help from another person eating meals?: None Help from another  person taking care of personal grooming?: None Help from another person toileting, which includes using toliet, bedpan, or urinal?: None Help from another person bathing (including washing, rinsing, drying)?: None Help from another person to put on and taking off regular upper body clothing?: None Help from another person to put on and taking off regular lower body clothing?: None 6 Click Score: 24    End of Session Equipment Utilized During Treatment: Gait belt;Rolling walker (2 wheels);Back brace  OT Visit Diagnosis: Unsteadiness on feet (R26.81);Other abnormalities of gait and mobility (R26.89);Muscle weakness (generalized) (M62.81)   Activity Tolerance Patient tolerated treatment well   Patient Left Other (comment) (standing near bed)   Nurse Communication Mobility status        Time: 1610-9604 OT Time Calculation (min): 26 min  Charges: OT General Charges $OT Visit: 1 Visit OT Treatments $Self Care/Home Management : 23-37 mins  Lynnda Shields 06/27/2023, 11:19  AM

## 2023-06-27 NOTE — Progress Notes (Signed)
    Subjective: Procedure(s) (LRB): REMOVAL POSTERIOR PEDICLE SCREWS AND INTERBODY CAGE (N/A) ANTERIOR LUMBAR INTERBODY FUSION LUMBAR FIVE-SACRAL ONE WITH INTERVERTEBRAL CAGE (N/A) ANTERIOR ILIAC CREST BONE GRAFT HARVEST (Left) ABDOMINAL EXPOSURE (N/A) POSTERIOR PEDICLE SCREW FIXATION AND ARTHRODESIS LUMBAR FIVE-SACRAL ONE (N/A) 2 Days Post-Op  Patient reports pain as 4 on 0-10 scale.  Reports decreased leg pain denies incisional back pain or abdominal pain. States that his hip is hurting the most at this time from the bone graft  Positive void Positive bowel movement Positive flatus Negative chest pain or shortness of breath  Objective: Vital signs in last 24 hours: Temp:  [97.8 F (36.6 C)-99.4 F (37.4 C)] 98.2 F (36.8 C) (03/05 0354) Pulse Rate:  [92-108] 92 (03/05 0354) Resp:  [18-20] 20 (03/05 0354) BP: (131-140)/(67-90) 140/81 (03/05 0354) SpO2:  [97 %-100 %] 98 % (03/05 0354)  Intake/Output from previous day: 03/04 0701 - 03/05 0700 In: 480 [P.O.:480] Out: -   Labs: Recent Labs    06/25/23 1709  WBC 10.6*  RBC 4.36  HCT 40.9  PLT 219   Recent Labs    06/25/23 1709  CREATININE 0.95   No results for input(s): "LABPT", "INR" in the last 72 hours.  Physical Exam: Neurologically intact ABD soft Neurovascular intact Sensation intact distally Intact pulses distally Dorsiflexion/Plantar flexion intact Incision: dressing C/D/I Compartment soft Body mass index is 26.29 kg/m.   Assessment/Plan:  Gary Luna is a very pleasant 58 year old male who is POD 2 L5-S1 ALIF and PSIF L5-S1 with removal of prior hardware with iliac crest bone graft harvest. Overall, the surgery went well and was without complications. Overall, post-operatively he is doing well and reports minimal back and abdominal pain. He reports that most of his pain is coming from the hip due to the iliac crest bone graft. He is ambulating, voiding, and passing flatus. Positive BM. He states that  his pain is well controlled. Dressing are C/D./I.He is tolerating oral intake well. He will continue work with PT today. He can be D/C tomorrow or Friday.   PLAN: Continue mobilization with physical therapy Continue care  Continue Incentive spirometry  Continue wound/ dressing care  Continue anticoagulation injections Will have doppler US to assess for DVT as routine ALIF post-op orders D/C to home tomorrow or Friday    [employer] PA-C [facility] [phone]

## 2023-06-27 NOTE — Progress Notes (Signed)
 [facility]  Subjective  -sitting on the edge of the bed.   Objective (!) 140/81 92 98.2 F (36.8 C) (Oral) 20 98%  Intake/Output Summary (Last 24 hours) at 06/27/2023 0827 Last data filed at 06/26/2023 1810 Gross per 24 hour  Intake 480 ml  Output --  Net 480 ml    Abdomen soft with appropriate postop incisional tenderness Left DP palpable  Laboratory Lab Results: Recent Labs    06/25/23 1709  WBC 10.6*  HGB 13.6  HCT 40.9  PLT 219   BMET Recent Labs    06/25/23 1709  CREATININE 0.95    COAG Lab Results  Component Value Date   INR 1.02 06/02/2011   No results found for: "PTT"  Assessment/Planning:  Postop day 2 status post anterior spine exposure for L5-S1 ALIF.  Overall looks good and is making good progress.  Continue to mobilize.  Vascular is available as needed.  Cephus Shelling 06/27/2023 8:27 AM --

## 2023-06-28 LAB — GLUCOSE, CAPILLARY
Glucose-Capillary: 115 mg/dL — ABNORMAL HIGH (ref 70–99)
Glucose-Capillary: 113 mg/dL — ABNORMAL HIGH (ref 70–99)

## 2023-06-28 NOTE — Discharge Summary (Signed)
 Patient ID: Gary Luna MRN: [id number] DOB/AGE: [birth year] 58 y.o.  Admit date: 06/25/2023 Discharge date: 06/28/2023  Admission Diagnoses:  Principal Problem:   S/P lumbar fusion   Discharge Diagnoses:  Principal Problem:   S/P lumbar fusion  status post Procedure(s): REMOVAL POSTERIOR PEDICLE SCREWS AND INTERBODY CAGE ANTERIOR LUMBAR INTERBODY FUSION LUMBAR FIVE-SACRAL ONE WITH INTERVERTEBRAL CAGE ANTERIOR ILIAC CREST BONE GRAFT HARVEST ABDOMINAL EXPOSURE POSTERIOR PEDICLE SCREW FIXATION AND ARTHRODESIS LUMBAR FIVE-SACRAL ONE  Past Medical History:  Diagnosis Date   Arthritis    Diabetes mellitus without complication (HCC)    Dysrhythmia 2011   no tests done   Hypertension     Surgeries: Procedure(s): REMOVAL POSTERIOR PEDICLE SCREWS AND INTERBODY CAGE ANTERIOR LUMBAR INTERBODY FUSION LUMBAR FIVE-SACRAL ONE WITH INTERVERTEBRAL CAGE ANTERIOR ILIAC CREST BONE GRAFT HARVEST ABDOMINAL EXPOSURE POSTERIOR PEDICLE SCREW FIXATION AND ARTHRODESIS LUMBAR FIVE-SACRAL ONE on 06/25/2023   Consultants: Treatment Team:  Cephus Shelling, MD Leonie Douglas, MD  Discharged Condition: Improved  Hospital Course: Gary Luna is an 58 y.o. male who was admitted 06/25/2023 for operative treatment of S/P lumbar fusion. Patient failed conservative treatments (please see the history and physical for the specifics) and had severe unremitting pain that affects sleep, daily activities and work/hobbies. After pre-op clearance, the patient was taken to the operating room on 06/25/2023 and underwent  Procedure(s): REMOVAL POSTERIOR PEDICLE SCREWS AND INTERBODY CAGE ANTERIOR LUMBAR INTERBODY FUSION LUMBAR FIVE-SACRAL ONE WITH INTERVERTEBRAL CAGE ANTERIOR ILIAC CREST BONE GRAFT HARVEST ABDOMINAL EXPOSURE POSTERIOR PEDICLE SCREW FIXATION AND ARTHRODESIS LUMBAR FIVE-SACRAL ONE.    Patient was given perioperative antibiotics:  Anti-infectives (From admission, onward)    Start     Dose/Rate  Route Frequency Ordered Stop   06/25/23 1800  ceFAZolin (ANCEF) IVPB 1 g/50 mL premix        1 g 100 mL/hr over 30 Minutes Intravenous Every 8 hours 06/25/23 1651 06/26/23 0139   06/25/23 1439  vancomycin (VANCOCIN) powder  Status:  Discontinued          As needed 06/25/23 1440 06/25/23 1533   06/25/23 0546  ceFAZolin (ANCEF) IVPB 2g/100 mL premix        2 g 200 mL/hr over 30 Minutes Intravenous 30 min pre-op 06/25/23 0546 06/25/23 1210        Patient was given sequential compression devices and early ambulation to prevent DVT.   Patient benefited maximally from hospital stay and there were no complications. At the time of discharge, the patient was urinating/moving their bowels without difficulty, tolerating a regular diet, pain is controlled with oral pain medications and they have been cleared by PT/OT.   Recent vital signs: Patient Vitals for the past 24 hrs:  BP Temp Temp src Pulse Resp SpO2  06/28/23 0322 128/78 98.4 F (36.9 C) Oral 80 18 96 %  06/27/23 2257 128/78 99 F (37.2 C) Oral 90 18 97 %  06/27/23 2019 116/74 98.4 F (36.9 C) Oral 97 18 98 %  06/27/23 1746 122/84 99.1 F (37.3 C) Oral 92 18 98 %  06/27/23 0840 133/76 97.9 F (36.6 C) Oral 94 18 98 %     Recent laboratory studies:  Recent Labs    06/25/23 1709  WBC 10.6*  HGB 13.6  HCT 40.9  PLT 219  CREATININE 0.95     Discharge Medications:   Allergies as of 06/28/2023       Reactions   Atorvastatin Other (See Comments)   Muscle spasms  Medication List     STOP taking these medications    acetaminophen 500 MG tablet Commonly known as: TYLENOL   D3 PO   multivitamins ther. w/minerals Tabs tablet       TAKE these medications    amLODipine 10 MG tablet Commonly known as: NORVASC Take 10 mg by mouth daily.   benazepril-hydrochlorthiazide 20-25 MG tablet Commonly known as: LOTENSIN HCT Take 1 tablet by mouth daily.   enoxaparin 40 MG/0.4ML injection Commonly known as:  LOVENOX Inject 0.4 mLs (40 mg total) into the skin daily for 10 days. 10 day supply 1 injection per day   Jardiance 25 MG Tabs tablet Generic drug: empagliflozin Take 25 mg by mouth daily.   metFORMIN 1000 MG tablet Commonly known as: GLUCOPHAGE Take 2,000 mg by mouth daily.   methocarbamol 500 MG tablet Commonly known as: ROBAXIN Take 1 tablet (500 mg total) by mouth every 8 (eight) hours as needed for up to 5 days for muscle spasms.   omega-3 acid ethyl esters 1 g capsule Commonly known as: LOVAZA Take 2 g by mouth daily.   ondansetron 4 MG tablet Commonly known as: Zofran Take 1 tablet (4 mg total) by mouth every 8 (eight) hours as needed for nausea or vomiting.   oxyCODONE-acetaminophen 10-325 MG tablet Commonly known as: Percocet Take 1 tablet by mouth every 6 (six) hours as needed for up to 5 days for pain.        Diagnostic Studies: VAS [place] LOWER EXTREMITY VENOUS (DVT) Result Date: 06/26/2023  Lower Venous DVT Study Patient Name:  Gary Luna  Date of Exam:   06/26/2023 Medical Rec #: [id number]       Accession #:    [id number] Date of Birth: [birth year]       Patient Gender: M Patient Age:   71 years Exam [facility]  [facility] Procedure:      VAS [place] LOWER EXTREMITY VENOUS (DVT) Referring Phys: Debria Garret BROOKS --------------------------------------------------------------------------------  Indications: S/p ALIF - r/o DVT.  Risk Factors: Surgery. Comparison Study: No prior studies. Performing Technologist: Chanda Busing RVT  Examination Guidelines: A complete evaluation includes B-mode imaging, spectral Doppler, color Doppler, and power Doppler as needed of all accessible portions of each vessel. Bilateral testing is considered an integral part of a complete examination. Limited examinations for reoccurring indications may be performed as noted. The reflux portion of the exam is performed with the patient in reverse Trendelenburg.   +---------+---------------+---------+-----------+----------+--------------+ RIGHT    CompressibilityPhasicitySpontaneityPropertiesThrombus Aging +---------+---------------+---------+-----------+----------+--------------+ CFV      Full           Yes      Yes                                 +---------+---------------+---------+-----------+----------+--------------+ SFJ      Full                                                        +---------+---------------+---------+-----------+----------+--------------+ FV Prox  Full                                                        +---------+---------------+---------+-----------+----------+--------------+  FV Mid   Full                                                        +---------+---------------+---------+-----------+----------+--------------+ FV DistalFull                                                        +---------+---------------+---------+-----------+----------+--------------+ PFV      Full                                                        +---------+---------------+---------+-----------+----------+--------------+ POP      Full           Yes      Yes                                 +---------+---------------+---------+-----------+----------+--------------+ PTV      Full                                                        +---------+---------------+---------+-----------+----------+--------------+ PERO     Full                                                        +---------+---------------+---------+-----------+----------+--------------+   +---------+---------------+---------+-----------+----------+--------------+ LEFT     CompressibilityPhasicitySpontaneityPropertiesThrombus Aging +---------+---------------+---------+-----------+----------+--------------+ CFV      Full           Yes      Yes                                  +---------+---------------+---------+-----------+----------+--------------+ SFJ      Full                                                        +---------+---------------+---------+-----------+----------+--------------+ FV Prox  Full                                                        +---------+---------------+---------+-----------+----------+--------------+ FV Mid   Full                                                        +---------+---------------+---------+-----------+----------+--------------+  FV DistalFull                                                        +---------+---------------+---------+-----------+----------+--------------+ PFV      Full                                                        +---------+---------------+---------+-----------+----------+--------------+ POP      Full           Yes      Yes                                 +---------+---------------+---------+-----------+----------+--------------+ PTV      Full                                                        +---------+---------------+---------+-----------+----------+--------------+ PERO     Full                                                        +---------+---------------+---------+-----------+----------+--------------+     Summary: RIGHT: - There is no evidence of deep vein thrombosis in the lower extremity.  - No cystic structure found in the popliteal fossa.  LEFT: - There is no evidence of deep vein thrombosis in the lower extremity.  - No cystic structure found in the popliteal fossa.  *See table(s) above for measurements and observations. Electronically signed by Carolynn Sayers on 06/26/2023 at 3:15:33 PM.    Final    DG Lumbar Spine 2-3 Views Result Date: 06/25/2023 CLINICAL DATA:  Elective surgery. EXAM: LUMBAR SPINE - 2-3 VIEW COMPARISON:  Radiograph 08/08/2022 FINDINGS: Three fluoroscopic spot views of the lumbar spine submitted from the operating room.  New anterior fusion hardware at L5-S1. Posterior fusion hardware at L5-S1 remains in place. Interbody spacer at L4-L5 and L3-L4 again seen. The more superior posterior fusion hardware on prior is not seen. Fluoroscopy time 2 minutes 17 seconds. Dose 94.07 mGy. IMPRESSION: Procedural fluoroscopy during lumbar surgery. Electronically Signed   By: Narda Rutherford M.D.   On: 06/25/2023 16:37   DG C-Arm 1-60 Min-No Report Result Date: 06/25/2023 Fluoroscopy was utilized by the requesting physician.  No radiographic interpretation.   DG C-Arm 1-60 Min-No Report Result Date: 06/25/2023 Fluoroscopy was utilized by the requesting physician.  No radiographic interpretation.   DG C-Arm 1-60 Min-No Report Result Date: 06/25/2023 Fluoroscopy was utilized by the requesting physician.  No radiographic interpretation.   DG C-Arm 1-60 Min-No Report Result Date: 06/25/2023 Fluoroscopy was utilized by the requesting physician.  No radiographic interpretation.   DG C-Arm 1-60 Min-No Report Result Date: 06/25/2023 Fluoroscopy was utilized by the requesting physician.  No radiographic interpretation.   DG C-Arm 1-60 Min-No Report Result Date: 06/25/2023 Fluoroscopy was utilized  by the requesting physician.  No radiographic interpretation.   DG C-Arm 1-60 Min-No Report Result Date: 06/25/2023 Fluoroscopy was utilized by the requesting physician.  No radiographic interpretation.   DG OR LOCAL ABDOMEN Result Date: 06/25/2023 CLINICAL DATA:  Elective surgery, anterior lumbar interbody fusion. EXAM: OR LOCAL ABDOMEN COMPARISON:  None Available. FINDINGS: No unexpected radiopaque foreign body. Interval lumbar fusion revision, suboptimally evaluated in a single AP view. IMPRESSION: 1. No unexpected radiopaque foreign body. Critical Value/emergent results were called by telephone at the time of interpretation on 06/25/2023 at 1:54 pm to provider [facility] , who verbally acknowledged these results. 2. Interval lumbar fusion  revision, suboptimally evaluated on a single AP view. Electronically Signed   By: Leanna Battles M.D.   On: 06/25/2023 13:54       Follow-up Information     Venita Lick, MD. Schedule an appointment as soon as possible for a visit in 2 week(s).   Specialty: Orthopedic Surgery Why: If symptoms worsen, For suture removal, For wound re-check Contact information: [address] [place] [address] [phone]                 Discharge Plan:  discharge to home today   Disposition:   Gary Luna is a very pleasant 58 year old male who is POD 3 of Posterior Hardware removal, ALIF L5-S1 with iliac crest bone graft harvest with PSIF L5-S1. Overall, surgery went well without complications. His hospital course has been uncomplicated. He has been tolerating oral intake well, he has been ambulating independently, positive flatus, positive void, positive BM. Patient states that his pain is well managed with his oral pain medications. He denies any incisional pain; he states that most of his pain is from his left hip from the bone graft. Patient has been cleared by PT. Doppler U/S unremarkable post-operatively. Patient has been educated on post-operative dressing care and post-operative Lovenox injections. Patient instructed not to shower until POD 5. Patient will follow up with [place] in the office 2 weeks after surgery.     Signed: Luther Bradley for Dr. Venita Lick [facility] [phone] 06/28/2023, 7:35 AM

## 2023-06-28 NOTE — Progress Notes (Signed)
 Patient awaiting family for discharge home, Patient in no acute distress nor complaints of pain nor discomfort; incision on back is clean, dry and intact; No c/o pain at this time. Room was checked and accounted for all patient's belongings; discharge instructions concerning her medications, incision care, follow up appointment and when to call the doctor as needed were all discussed with patient by RN and he expressed understanding on the instructions given.

## 2023-06-29 MED FILL — Heparin Sodium (Porcine) Inj 1000 Unit/ML: INTRAMUSCULAR | Qty: 30 | Status: AC

## 2023-07-25 DIAGNOSIS — M545 Low back pain, unspecified: Secondary | ICD-10-CM | POA: Diagnosis not present

## 2023-08-08 DIAGNOSIS — Z4889 Encounter for other specified surgical aftercare: Secondary | ICD-10-CM | POA: Diagnosis not present

## 2023-08-20 DIAGNOSIS — E782 Mixed hyperlipidemia: Secondary | ICD-10-CM | POA: Diagnosis not present

## 2023-08-20 DIAGNOSIS — I10 Essential (primary) hypertension: Secondary | ICD-10-CM | POA: Diagnosis not present

## 2023-08-20 DIAGNOSIS — E1169 Type 2 diabetes mellitus with other specified complication: Secondary | ICD-10-CM | POA: Diagnosis not present

## 2023-08-20 DIAGNOSIS — N529 Male erectile dysfunction, unspecified: Secondary | ICD-10-CM | POA: Diagnosis not present

## 2023-09-19 DIAGNOSIS — Z4889 Encounter for other specified surgical aftercare: Secondary | ICD-10-CM | POA: Diagnosis not present

## 2023-12-19 DIAGNOSIS — M545 Low back pain, unspecified: Secondary | ICD-10-CM | POA: Diagnosis not present

## 2023-12-19 DIAGNOSIS — Z981 Arthrodesis status: Secondary | ICD-10-CM | POA: Diagnosis not present

## 2024-02-21 DIAGNOSIS — Z6827 Body mass index (BMI) 27.0-27.9, adult: Secondary | ICD-10-CM | POA: Diagnosis not present

## 2024-02-21 DIAGNOSIS — N529 Male erectile dysfunction, unspecified: Secondary | ICD-10-CM | POA: Diagnosis not present

## 2024-02-21 DIAGNOSIS — I10 Essential (primary) hypertension: Secondary | ICD-10-CM | POA: Diagnosis not present

## 2024-02-21 DIAGNOSIS — E1169 Type 2 diabetes mellitus with other specified complication: Secondary | ICD-10-CM | POA: Diagnosis not present

## 2024-02-21 DIAGNOSIS — Z0000 Encounter for general adult medical examination without abnormal findings: Secondary | ICD-10-CM | POA: Diagnosis not present

## 2024-02-21 DIAGNOSIS — E782 Mixed hyperlipidemia: Secondary | ICD-10-CM | POA: Diagnosis not present
